# Patient Record
Sex: Female | Born: 1955 | Race: White | Hispanic: No | Marital: Married | Smoking: Former smoker
Health system: Southern US, Community
[De-identification: ages and names within clinical notes are randomized; demographics above are authoritative.]

## PROBLEM LIST (undated history)

## (undated) DIAGNOSIS — M1990 Unspecified osteoarthritis, unspecified site: Secondary | ICD-10-CM

## (undated) DIAGNOSIS — I2510 Atherosclerotic heart disease of native coronary artery without angina pectoris: Secondary | ICD-10-CM

## (undated) DIAGNOSIS — I219 Acute myocardial infarction, unspecified: Secondary | ICD-10-CM

## (undated) DIAGNOSIS — I10 Essential (primary) hypertension: Secondary | ICD-10-CM

## (undated) DIAGNOSIS — E785 Hyperlipidemia, unspecified: Secondary | ICD-10-CM

## (undated) HISTORY — DX: Acute myocardial infarction, unspecified: I21.9

## (undated) HISTORY — DX: Unspecified osteoarthritis, unspecified site: M19.90

## (undated) HISTORY — DX: Essential (primary) hypertension: I10

## (undated) HISTORY — DX: Atherosclerotic heart disease of native coronary artery without angina pectoris: I25.10

## (undated) HISTORY — DX: Hyperlipidemia, unspecified: E78.5

## (undated) HISTORY — PX: NO PAST SURGERIES: SHX2092

---

## 2000-03-29 ENCOUNTER — Encounter (INDEPENDENT_AMBULATORY_CARE_PROVIDER_SITE_OTHER): Payer: Self-pay

## 2000-03-29 ENCOUNTER — Other Ambulatory Visit: Admission: RE | Admit: 2000-03-29 | Discharge: 2000-03-29 | Payer: Self-pay | Admitting: Obstetrics and Gynecology

## 2000-04-11 ENCOUNTER — Ambulatory Visit (HOSPITAL_COMMUNITY): Admission: RE | Admit: 2000-04-11 | Discharge: 2000-04-11 | Payer: Self-pay | Admitting: Obstetrics and Gynecology

## 2000-04-11 ENCOUNTER — Encounter: Payer: Self-pay | Admitting: Obstetrics and Gynecology

## 2003-11-25 ENCOUNTER — Emergency Department (HOSPITAL_COMMUNITY): Admission: EM | Admit: 2003-11-25 | Discharge: 2003-11-25 | Payer: Self-pay

## 2003-11-25 ENCOUNTER — Emergency Department (HOSPITAL_COMMUNITY): Admission: EM | Admit: 2003-11-25 | Discharge: 2003-11-25 | Payer: Self-pay | Admitting: Emergency Medicine

## 2003-11-26 ENCOUNTER — Ambulatory Visit (HOSPITAL_COMMUNITY): Admission: RE | Admit: 2003-11-26 | Discharge: 2003-11-26 | Payer: Self-pay | Admitting: Gastroenterology

## 2003-11-30 ENCOUNTER — Ambulatory Visit (HOSPITAL_COMMUNITY): Admission: RE | Admit: 2003-11-30 | Discharge: 2003-11-30 | Payer: Self-pay | Admitting: Gastroenterology

## 2006-05-22 ENCOUNTER — Encounter: Admission: RE | Admit: 2006-05-22 | Discharge: 2006-05-22 | Payer: Self-pay | Admitting: Family Medicine

## 2006-05-27 ENCOUNTER — Encounter: Admission: RE | Admit: 2006-05-27 | Discharge: 2006-05-27 | Payer: Self-pay | Admitting: Family Medicine

## 2007-03-19 ENCOUNTER — Ambulatory Visit: Payer: Self-pay | Admitting: Cardiology

## 2007-03-26 ENCOUNTER — Ambulatory Visit: Payer: Self-pay

## 2007-03-26 ENCOUNTER — Encounter: Payer: Self-pay | Admitting: Cardiology

## 2007-04-02 ENCOUNTER — Ambulatory Visit: Payer: Self-pay | Admitting: Cardiology

## 2007-04-02 LAB — CONVERTED CEMR LAB
Basophils Absolute: 0.1 10*3/uL (ref 0.0–0.1)
Eosinophils Relative: 3.7 % (ref 0.0–5.0)
GFR calc non Af Amer: 70 mL/min
HCT: 43 % (ref 36.0–46.0)
Hemoglobin: 14.8 g/dL (ref 12.0–15.0)
Lymphocytes Relative: 35.6 % (ref 12.0–46.0)
MCV: 94.5 fL (ref 78.0–100.0)
Monocytes Relative: 6.8 % (ref 3.0–11.0)
Neutro Abs: 6.5 10*3/uL (ref 1.4–7.7)
Potassium: 4.2 meq/L (ref 3.5–5.1)
Prothrombin Time: 11.7 s (ref 10.9–13.3)
RBC: 4.55 M/uL (ref 3.87–5.11)
RDW: 12.3 % (ref 11.5–14.6)
WBC: 12.1 10*3/uL — ABNORMAL HIGH (ref 4.5–10.5)

## 2007-04-04 ENCOUNTER — Ambulatory Visit: Payer: Self-pay | Admitting: Cardiovascular Disease

## 2007-04-04 ENCOUNTER — Inpatient Hospital Stay (HOSPITAL_BASED_OUTPATIENT_CLINIC_OR_DEPARTMENT_OTHER): Admission: RE | Admit: 2007-04-04 | Discharge: 2007-04-04 | Payer: Self-pay | Admitting: Cardiovascular Disease

## 2007-04-08 ENCOUNTER — Ambulatory Visit: Payer: Self-pay

## 2007-04-14 ENCOUNTER — Ambulatory Visit: Payer: Self-pay | Admitting: Cardiology

## 2007-05-12 ENCOUNTER — Ambulatory Visit: Payer: Self-pay | Admitting: Cardiology

## 2007-05-12 LAB — CONVERTED CEMR LAB
ALT: 19 units/L (ref 0–35)
Alkaline Phosphatase: 91 units/L (ref 39–117)
BUN: 23 mg/dL (ref 6–23)
CO2: 27 meq/L (ref 19–32)
Calcium: 9.4 mg/dL (ref 8.4–10.5)
GFR calc Af Amer: 96 mL/min
HDL: 42.6 mg/dL (ref >39.0)
Potassium: 4.7 meq/L (ref 3.5–5.1)
Sodium: 137 meq/L (ref 135–145)
Total Protein: 7.6 g/dL (ref 6.0–8.3)

## 2007-06-19 ENCOUNTER — Ambulatory Visit: Payer: Self-pay | Admitting: Cardiology

## 2007-12-22 ENCOUNTER — Ambulatory Visit: Payer: Self-pay | Admitting: Cardiology

## 2007-12-22 LAB — CONVERTED CEMR LAB
Albumin: 3.8 g/dL (ref 3.5–5.2)
Bilirubin, Direct: 0.1 mg/dL (ref 0.0–0.3)
Cholesterol: 125 mg/dL (ref 0–200)
LDL Cholesterol: 68 mg/dL (ref 0–99)
Total Bilirubin: 0.6 mg/dL (ref 0.3–1.2)
Total CHOL/HDL Ratio: 3.1
VLDL: 16 mg/dL (ref 0–40)

## 2008-03-24 ENCOUNTER — Ambulatory Visit: Payer: Self-pay | Admitting: Cardiology

## 2008-08-11 ENCOUNTER — Encounter (INDEPENDENT_AMBULATORY_CARE_PROVIDER_SITE_OTHER): Payer: Self-pay | Admitting: *Deleted

## 2008-12-07 ENCOUNTER — Telehealth (INDEPENDENT_AMBULATORY_CARE_PROVIDER_SITE_OTHER): Payer: Self-pay | Admitting: *Deleted

## 2009-03-02 ENCOUNTER — Telehealth: Payer: Self-pay | Admitting: Cardiology

## 2009-03-09 DIAGNOSIS — I10 Essential (primary) hypertension: Secondary | ICD-10-CM

## 2009-03-09 DIAGNOSIS — E785 Hyperlipidemia, unspecified: Secondary | ICD-10-CM | POA: Insufficient documentation

## 2009-04-28 ENCOUNTER — Ambulatory Visit: Payer: Self-pay | Admitting: Cardiology

## 2009-04-28 DIAGNOSIS — I2510 Atherosclerotic heart disease of native coronary artery without angina pectoris: Secondary | ICD-10-CM

## 2009-04-28 DIAGNOSIS — I252 Old myocardial infarction: Secondary | ICD-10-CM

## 2009-05-04 ENCOUNTER — Telehealth (INDEPENDENT_AMBULATORY_CARE_PROVIDER_SITE_OTHER): Payer: Self-pay | Admitting: *Deleted

## 2009-05-05 ENCOUNTER — Ambulatory Visit: Payer: Self-pay | Admitting: Cardiology

## 2009-05-05 ENCOUNTER — Encounter (HOSPITAL_COMMUNITY): Admission: RE | Admit: 2009-05-05 | Discharge: 2009-07-19 | Payer: Self-pay | Admitting: Cardiology

## 2009-05-05 ENCOUNTER — Ambulatory Visit: Payer: Self-pay

## 2009-05-05 ENCOUNTER — Ambulatory Visit: Payer: Self-pay | Admitting: Cardiovascular Disease

## 2009-05-09 LAB — CONVERTED CEMR LAB
AST: 21 units/L (ref 0–37)
Cholesterol: 159 mg/dL (ref 0–200)
LDL Cholesterol: 82 mg/dL (ref 0–99)
Potassium: 4.1 meq/L (ref 3.5–5.1)
Sodium: 142 meq/L (ref 135–145)
Total Bilirubin: 0.3 mg/dL (ref 0.3–1.2)
Total Protein: 7.8 g/dL (ref 6.0–8.3)

## 2009-08-11 ENCOUNTER — Telehealth: Payer: Self-pay | Admitting: Cardiology

## 2009-11-02 ENCOUNTER — Telehealth (INDEPENDENT_AMBULATORY_CARE_PROVIDER_SITE_OTHER): Payer: Self-pay | Admitting: *Deleted

## 2009-11-29 ENCOUNTER — Telehealth (INDEPENDENT_AMBULATORY_CARE_PROVIDER_SITE_OTHER): Payer: Self-pay | Admitting: *Deleted

## 2010-04-09 ENCOUNTER — Encounter: Payer: Self-pay | Admitting: Gastroenterology

## 2010-04-18 NOTE — Progress Notes (Signed)
Summary: Nuclear Pre-Procedure  Phone Note Outgoing Call Call back at [facility] Phone [phone]   Call placed by: Stanton Kidney, EMT-P,  May 04, 2009 2:05 PM Call placed to: Patient Action Taken: Phone Call Completed Summary of Call: Reviewed information on Myoview Information Sheet (see scanned document for further details).  Spoke with Patient.    Nuclear Med Background Indications for Stress Test: Evaluation for Ischemia   History: Echo, Heart Catheterization, Myocardial Infarction, Myocardial Perfusion Study  History Comments: 1/09 Echo: EF=40%, wall motion consistant w/ MI 1/09 Heart Cath: EF=45%, 100% LAD w/ coll., RCA=70% 1/09 MPS: EF=40%,  ABN.     Nuclear Pre-Procedure Cardiac Risk Factors: Family History - CAD, History of Smoking, Hypertension, Lipids Height (in): 62

## 2010-04-18 NOTE — Assessment & Plan Note (Signed)
Summary: f88m/jss    Visit Type:  6 mo f/u Primary Provider:  Marny Lowenstein  CC:  pt had hand surgery back in June 2010 on the left hand...no cardiac complaints today.  History of Present Illness: Mrs  [name] comes in today for further evaluation and management of her coronary disease, history of silent myocardial infarction, moderate LV dysfunction, hypertension, mixed hyperlipidemia.  She continues not to smoke. She has lost another 6 or 7 pounds. She feels remarkably well with no symptoms of ischemia or angina. She also denies orthopnea, PND or peripheral edema. She has no significant dyspnea on exertion.  Clinical Reports Reviewed:  Nuclear Study:  04/08/2007:  Excerise capacity: Poor exercise capacity  Blood Pressure response: Hypertensive blood pressure response  Clinical symptoms: There is dyspena  ECG impression: No significant ST segment change suggestive of ischemia  Overall impression: Abnormal stress nulcear study with extensive prior anterior, septal, and apical infarct and minmal peri-infarct ischemia.  Olga Millers, MD   Current Medications (verified): 1)  Carvedilol 6.25 Mg Tabs (Carvedilol) .Marland Kitchen.. 1 Tab Two Times A Day 2)  Lisinopril 5 Mg Tabs (Lisinopril) .Marland Kitchen.. 1 Tab Once Daily 3)  Simvastatin 40 Mg Tabs (Simvastatin) .Marland Kitchen.. 1 Tab At Bedtime 4)  Aspirin 81 Mg Tbec (Aspirin) .... Take One Tablet By Mouth Daily  Allergies: 1)  ! Sulfa 2)  ! Pcn 3)  ! * Ivp Dye  Past History:  Past Medical History: Last updated: 04-07-2009 HYPERTENSION (ICD-401.9) HYPERLIPIDEMIA (ICD-272.4) TOBACCO USE, QUIT (ICD-V15.82)    Past Surgical History: Last updated: Apr 07, 2009 No surgery to report  Family History: Last updated: 04-07-2009 Father: deceased  Social History: Last updated: 04-07-2009 Full Time Married  Tobacco Use - Former. ..quit 2008 Alcohol Use - yes Drug Use - no  Risk Factors: Smoking Status: quit (2009-04-07)  Review of Systems   negative other than history of present illness  Vital Signs:  Patient profile:   55 year old female Height:      62 inches Weight:      151 pounds BMI:     27.72 Pulse rate:   73 / minute Pulse rhythm:   irregular BP sitting:   134 / 90  (left arm) Cuff size:   large  Vitals Entered By: Danielle Rankin, CMA (April 28, 2009 11:12 AM)  Physical Exam  General:  Well developed, well nourished, in no acute distress. Head:  normocephalic and atraumatic Eyes:  PERRLA/EOM intact; conjunctiva and lids normal. Mouth:  Teeth, gums and palate normal. Oral mucosa normal. Neck:  Neck supple, no JVD. No masses, thyromegaly or abnormal cervical nodes. Chest Shaquavia Whisonant:  no deformities or breast masses noted Lungs:  Clear bilaterally to auscultation and percussion. Heart:  Non-displaced PMI, chest non-tender; regular rate and rhythm, S1, S2 without murmurs, rubs or gallops. Carotid upstroke normal, no bruit. Normal abdominal aortic size, no bruits. Femorals normal pulses, no bruits. Pedals normal pulses. No edema, no varicosities. Abdomen:  Bowel sounds positive; abdomen soft and non-tender without masses, organomegaly, or hernias noted. No hepatosplenomegaly. Msk:  Back normal, normal gait. Muscle strength and tone normal. Pulses:  pulses normal in all 4 extremities Extremities:  No clubbing or cyanosis. Neurologic:  Alert and oriented x 3.   Problems:  Medical Problems Added: 1)  Dx of Encounter For Long-term Use of Other Medications  (ICD-V58.69) 2)  Dx of Old Myocardial Infarction  (ICD-412) 3)  Dx of Cad, Native Vessel  (ICD-414.01)  EKG  Procedure date:  04/28/2009  Findings:  normal sinus rhythm, low voltage QRS, poor R. progression across the precordium consistent with a previous anterior [place] Valliant infarct. Nonspecific ST segment changes. EKG is stable.  Impression & Recommendations:  Problem # 1:  CAD, NATIVE VESSEL (ICD-414.01) Assessment Unchanged  Her updated medication list for  this problem includes:    Carvedilol 6.25 Mg Tabs (Carvedilol) .Marland Kitchen... 1 tab two times a day    Lisinopril 5 Mg Tabs (Lisinopril) .Marland Kitchen... 1 tab once daily    Aspirin 81 Mg Tbec (Aspirin) .Marland Kitchen... Take one tablet by mouth daily  Orders: EKG w/ Interpretation (93000) Nuclear Stress Test (Nuc Stress Test)  Problem # 2:  OLD MYOCARDIAL INFARCTION (ICD-412) Assessment: Unchanged She had a silent MI in the past. It is clinically indicated to perform a stress test with objective assessment of her coronary disease each year. We will arrange for this as an exercise rest rest Myoview off the beta blocker. Her updated medication list for this problem includes:    Carvedilol 6.25 Mg Tabs (Carvedilol) .Marland Kitchen... 1 tab two times a day    Lisinopril 5 Mg Tabs (Lisinopril) .Marland Kitchen... 1 tab once daily    Aspirin 81 Mg Tbec (Aspirin) .Marland Kitchen... Take one tablet by mouth daily  Problem # 3:  HYPERTENSION (ICD-401.9) Assessment: Improved  Her updated medication list for this problem includes:    Carvedilol 6.25 Mg Tabs (Carvedilol) .Marland Kitchen... 1 tab two times a day    Lisinopril 5 Mg Tabs (Lisinopril) .Marland Kitchen... 1 tab once daily    Aspirin 81 Mg Tbec (Aspirin) .Marland Kitchen... Take one tablet by mouth daily  Problem # 4:  HYPERLIPIDEMIA (ICD-272.4) Assessment: Unchanged  she needs fasting lipids and a conference metabolic panel. Her updated medication list for this problem includes:    Simvastatin 40 Mg Tabs (Simvastatin) .Marland Kitchen... 1 tab at bedtime  Patient Instructions: 1)  Your physician recommends that you schedule a follow-up appointment in: 12 MONTHS WITH DR Lorelei Heikkila 2)  Your physician recommends that you return for lab work in:DAY OF STRESS TEST BMET LIPID LIVER 3)  Your physician recommends that you continue on your current medications as directed. Please refer to the Current Medication list given to you today. 4)  Your physician has requested that you have an exercise stress myoview.  For further information please visit [email or web address]   Please follow instruction sheet, as given.

## 2010-04-18 NOTE — Progress Notes (Signed)
  Request recieved from [facility] sent to [facility]  November 02, 2009 9:33 AM

## 2010-04-18 NOTE — Assessment & Plan Note (Signed)
Summary: Cardiology Nuclear Study  Nuclear Med Background Indications for Stress Test: Evaluation for Ischemia   History: Echo, Heart Catheterization, Myocardial Infarction, Myocardial Perfusion Study  History Comments: 1/09 Echo: EF=40%, wall motion consistant w/ MI 1/09 Heart Cath: EF=45%, 100% LAD w/ coll., RCA=70% 1/09 MPS: EF=40%,  ABN.  Symptoms: Palpitations    Nuclear Pre-Procedure Cardiac Risk Factors: Family History - CAD, History of Smoking, Hypertension, Lipids Caffeine/Decaff Intake: None NPO After: 12:00 AM Lungs: clear IV 0.9% NS with Angio Cath: 22g     IV Site: (R) AC IV Started by: Irean Hong RN Chest Size (in) 36     Cup Size B     Height (in): 63 Weight (lb): 149 BMI: 26.49  Nuclear Med Study 1 or 2 day study:  1 day     Stress Test Type:  Stress Reading MD:  Charlton Haws, MD     Referring MD:  T.Wall Resting Radionuclide:  Technetium 75m Tetrofosmin     Resting Radionuclide Dose:  10.0 mCi  Stress Radionuclide:  Technetium 3m Tetrofosmin     Stress Radionuclide Dose:  33.0 mCi   Stress Protocol Exercise Time (min):  5:00 min     Max HR:  166 bpm     Predicted Max HR:  165 bpm  Max Systolic BP: 166 mm Hg     Percent Max HR:  100.61 %     METS: 7.0 Rate Pressure Product:  04540    Stress Test Technologist:  Milana Na EMT-P     Nuclear Technologist:  Burna Mortimer Deal RT-N  Rest Procedure  Myocardial perfusion imaging was performed at rest 45 minutes following the intravenous administration of Myoview Technetium 82m Tetrofosmin.  Stress Procedure  The patient exercised for 5:00. The patient stopped due to fatigue and denied any chest pain.  There were no significant ST-T wave changes and occ pvcs.  Myoview was injected at peak exercise and myocardial perfusion imaging was performed after a brief delay.  QPS Raw Data Images:  Normal; no motion artifact; normal heart/lung ratio. Stress Images:  Antapical infarct Rest Images:  Antapical  infarct Subtraction (SDS):  SDS 4 Transient Ischemic Dilatation:  1.17  (Normal <1.22)  Lung/Heart Ratio:  .32  (Normal <0.45)  Quantitative Gated Spect Images QGS EDV:  96 ml QGS ESV:  53 ml QGS EF:  45 % QGS cine images:  Anteroapical hypokinesis  Findings Low risk nuclear study  Evidence for anterior (septal apical) infarct  Evidence for LV Dysfunction LV Dysfunction    Overall Impression  Exercise Capacity: Poor exercise capacity. BP Response: Normal blood pressure response. Clinical Symptoms: No chest pain ECG Impression: No significant ST segment change suggestive of ischemia. Overall Impression: Moderate anteroapical infarct with no ischemia  Appended Document: Cardiology Nuclear Study stable. No change in treatment.  Appended Document: Cardiology Nuclear Study PT AWARE./CY

## 2010-04-18 NOTE — Progress Notes (Signed)
  EMSi request recieved sent to [facility]. Cal[facility]  November 29, 2009 12:59 PM     Appended Document:  EMSI request recieved sent to [facility]   Appended Document:  2nd request recieved from [facility] sent to [facility]   Appended Document:  Correction...this is the 3rd request

## 2010-04-18 NOTE — Progress Notes (Signed)
Summary:  upset why she was not told she had coronary heart disease  Phone Note Call from Patient Call back at Home Phone [phone]   Caller: Patient Reason for Call: Talk to Nurse Summary of Call: Per pt called, pt is pretty past upset that she was not disclosed of a conditon coronary heart disease. was told by susan in our office that the visit was not preventive care. now pt have $5,000  bill. pt express that the lady was very rude to her on the phone. office visit in 2/11. the test result  was given that her heart was stronger than ever. and the stress test normal. pt would like a call back today .  Initial call taken by: Lorne Skeens,  Aug 11, 2009 3:27 PM  Follow-up for Phone Call        SPOKE WITH PT IS UPSET BECAUSE WAS TOLD HAD CAD NOT AWARE IS ALSO UPSET BECAUSE STRESS WAS CODED AS IF HAD SYMPTOMS NO S/S PER PT IF CODED AS PREVENTIVE INSURANCE WOULD COVER ALL BUT CO PAY. Follow-up by: Scherrie Bateman, LPN,  Aug 11, 2009 3:57 PM  Additional Follow-up for Phone Call Additional follow up Details #1::        Spoke with pt at lenght. Victorino Dike will speak with Cydney Ok about coding and bill. Unfortunately we may not be able to do anything about [employer]. Additional Follow-up by: Gaylord Shih, MD, [facility],  Aug 12, 2009 10:00 AM     Appended Document:  upset why she was not told she had coronary heart disease The professional fee charges for interpretation appear to have been paid with less than $300 potentially charged to the patient.  An e-mail has been sent to [facility] for details of the charges from [facility] for that DOS.  More information to follow.  Appended Document:  upset why she was not told she had coronary heart disease OK. I suspect hospital charges may be revealing.  Appended Document:  upset why she was not told she had coronary heart disease The total charge for a nuclear study, billed the way that it is being billed, now, is close to $5000.  I cannot determine if the  change in the amount she had to pay was a result in the billing change or if it would have happened, regardless, given the way her particular policy is constructed.  No errors were made in coding or billing.  Efforts are being in our department to informa patients of the need for patients to inquire with their insurance company about coverage for imaging, in advance.  Appended Document:  upset why she was not told she had coronary heart disease ok...Marland KitchenMarland KitchenMarland Kitchenhopefully she didnt have to pay 5K. We need to discuss in next Section meeting. Please put on agenda and how much she had to pay.

## 2010-08-01 NOTE — Assessment & Plan Note (Signed)
[facility]                            CARDIOLOGY OFFICE NOTE   NAME:[name], Hailey CLEMENTS                      MRN:          [id number]  DATE:05/12/2007                            DOB:          July 16, 1955    [name] comes in today for further followup  of the following  issues.  1. Coronary artery disease with out of hospital infarct of the      anterior septal wall.  She has EF of 40%.  2. Chronic systolic heart failure  3. Probable hyperlipidemia.  4. Hypertension.  5. History of tobacco use.  She has not smoked in 10 months!   She would like to start walking on a treadmill.  I have encouraged to do  so.   She smells of tobacco but she says her husband smokes.   She has had a stomach ache, like eating a green apple.  She said this  has started since she started Carvedilol.  She is aware and I also  reinforce that this is very unlikely.  She denies any constipation.  She  has had no melena or hematochezia.  She denies any orthopnea, PND,  peripheral edema.  She does have some mild dyspnea on exertion.  She has  had no ischemic symptoms.   MEDICATIONS:  Lisinopril 5 mg a day, aspirin 81 mg a day,  Carvedilol  3.125 mg p.o. b.i.d..   She is for fasting lipids, comprehensive metabolic panel today.   Blood pressure 133/86, her pulse 77 and regular.  Her weight is 166 down  a pound.  HEENT:  Normocephalic, atraumatic.  PERRL.  Extraocular  movements is intact.  Sclera clear.  Facial symmetry normal.  NECK:  Carotids upstrokes are equal bilaterally without bruits, no JVD.  Thyroid is not enlarged.  Trachea is midline.  LUNGS were clear.  HEART:  Reveals a nondisplaced PMI.  She has normal S1-S2 without  gallop.  ABDOMEN:  Soft, good bowel sounds.  No organomegaly.  No lymphadenopathy  .  EXTREMITIES:  There is no sinus clubbing or edema.  Pulses are intact.  NEURO:  Exam is intact.  SKIN is unremarkable.   I have had a nice chat with [name] today.  I have encouraged her to  do the following:  1. Begin walking on her treadmill up to 3 hours per week.  2. Increase Carvedilol to 6.25 b.i.d..  If she has more problems or      worsening problems with her stomachache, she will call us before      she stops it.  3. Fasting lipids and comprehensive metabolic panel today.  She will      most likely need a statin as outlined in the aggressive goals for      her.  4. Continue aspirin.  5. Continue low-dose lisinopril which may have to be increased in the      future.   I will see her back in about 4 weeks.     Thomas C. Daleen Squibb, MD, [facility]  Electronically Signed    TCW/MedQ  DD: 05/12/2007  DT: 05/12/2007  Job #: [id number]

## 2010-08-01 NOTE — Assessment & Plan Note (Signed)
[facility]                            CARDIOLOGY OFFICE NOTE   NAME:[name], [name]                      MRN:          [id number]  DATE:04/02/2007                            DOB:          [birth year]    CHIEF COMPLAINT:  Fatigue and low energy level.   HISTORY OF PRESENT ILLNESS:  [name] is a 55 year old married  white female whom I saw in the office on March 19, 2007, with the  above complaint.  She was self-referred by one of my patients, her  father, Mr. Jenne Pane.   She has not been to a doctor on a regular basis.  She was told she had  hypertension several years ago, and took a low-dose of lisinopril 2.5 mg  a day.  She then lost 55 pounds with a drop in her blood pressure, at  which time the lisinopril was discontinued.   Her EKG in the office showed poor R-wave progression across the anterior  precordium.  Because of her hypertension and these findings, we obtained  a 2-D echocardiogram.  This showed septal and apical akinesia and  anterior hypokinesia.  Left ventricle was moderately dilated.  Her EF  was around 40%.  There was no significant LVH.  Left atrium was mildly  dilated.  The aortic valve was mildly calcific calcified.  She had  trivial aortic insufficiency.  She has had some dyspnea on exertion, but  no angina.   PAST MEDICAL HISTORY:  She reports having a red fascial reaction to CT  scan with contrast.   ALLERGIES:  She is allergic to SULFA and PENICILLIN.   CURRENT MEDS:  Lisinopril 5 mg a day.  I started this is in the office  last visit.   She does she does drink alcohol.  She does drink caffeine.  She quit  smoking 6 months ago.   SURGERY:  None.   FAMILY HISTORY:  Father has a history of diastolic dysfunction,  hypertension.  Her brother has also had some problems with this.   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  She is married and has no  children.  Her husband, Alinda Money, is with her today.   REVIEW OF SYSTEMS:  She has a history of a hiatal hernia and some bad  reflux in the past.  She is currently stable.  The rest of her review of  systems is negative.  She specifically denies any orthopnea, PND, or  peripheral edema.   PHYSICAL EXAMINATION:  A very pleasant lady in no acute distress.  Her blood pressure is 123/90; pulse is 81 and regular.  Weight is 163.  She is 5 feet 3 inches.  HEENT:  Normocephalic, atraumatic.  PERLA.  Extraocular movements  intact.  Sclerae are clear.  Face symmetry is normal.  Carotid upstrokes were equal bilaterally without bruits.  There is no  JVD.  Thyroid is not enlarged.  Trachea is midline.  LUNGS:  Clear.  HEART:  Reveals a nondisplaced PMI.  Normal S1-S2.  No gallop.  ABDOMINAL EXAM:  Soft, good bowel sounds.  EXTREMITIES:  No sinus clubbing or edema.  Pulses are intact.  MUSCULOSKELETAL:  Negative.  SKIN:  Unremarkable.   ASSESSMENT/PROBLEM LIST:  1. Fatigue, dyspnea on exertion, and a 2-D echocardiogram that says      several segmental wall motion abnormalities.  She may very well      have obstructive coronary disease.  She also had a viral illness      over the holidays, but this is not a global effect on her 2-D.  2. History of tobacco use which she quit 6 months ago.  3. Family history of heart disease.  4. Hypertension.  5. Possible contrast reaction in the past.   RECOMMENDATIONS:  I have recommended a heart catheterization as an  outpatient.  Indications, risks, and potential benefits have been  discussed.  We will prep her for a potential dye reaction.  She agrees  to proceed.     Thomas C. Daleen Squibb, MD, [facility]  Electronically Signed    TCW/MedQ  DD: 04/02/2007  DT: 04/02/2007  Job #: [id number]   cc:   Jethro Bastos, M.D.

## 2010-08-01 NOTE — Assessment & Plan Note (Signed)
[facility]                            CARDIOLOGY OFFICE NOTE   [name], [name]                      MRN:          [id number]  DATE:04/14/2007                            DOB:          12-14-1955    [name] comes in today for further management of her coronary  disease.   She initially presented to me with fatigue and shortness of breath.  Because of a viral illness and history of hypertension, we ordered a 2-D  echocardiogram to rule out a cardiomyopathy.  This surprisingly showed a  moderately dilated, left ventricle, septal and apical akinesia and  anterior hypokinesia with an EF of 40%.  There was mild, left atrial  dilatation.  The aortic valve was mildly calcified.   I discussed the findings with Dr. Eden Emms who read the echocardiogram.  We felt this was most likely consistent with coronary disease.   She relates a history of becoming extremely angry with her brother a  year ago at [facility].  She had severe chest pressure and shortness  of breath that only lasted about 5 minutes.  She thinks this is when she  may have had the heart attack.   We then performed a heart catheterization which showed a totally  occluded LAD that looked chronic.  There was moderate left to left  collaterals a very good right to left collaterals.  The circumflex had a  20% discrete disease proximally.  There was a 50-60% ostial lesion in  the first obtuse marginal.  The second obtuse marginal was large and  free of disease.  The right coronary was dominant.  There was a 70%  tubular lesion proximally.  Her EF was 45% with a fairly hyperdynamic  basal and mid function, but showed apical dyskinesia.  There was no  gradient across the aortic valve and no mitral regurgitation.   We reviewed the films with Dr. Juanda Chance and Dr. Excell Seltzer.  It was felt that  there would be no significant advantage and it may be actually be ridden  with complications trying to open  her totally occluded LAD.  We  performed a stress Myoview to see if there was any significant ischemia.   She only exercised for 4 minutes.  Her heart rate went to 163 which is  98% of predicted maximum heart rate.  Level achieved was 5.8.  She  became fatigue, short of breath, but did not have any chest pain.  Her  EF was 40% with akinesia of the distal anterior wall and apex.  There  was evidence of left ventricular enlargement.  She had minimal peri-  infarct ischemia with anterior septal and apical infarction.   I have reviewed all these findings with her today.  Her risk factors in  the past have been smoking, which she has discontinued and I have  reinforced, hypertension and family history.  I do not know her lipid  status.   We started her on low-dose lisinopril 5 mg a day for her hypertension  prior to all these studies.  Her blood pressure has been better.  She  denies any dizziness.  Her shortness of breath has slowly improved.  She  is anxious about treadmill.   PHYSICAL EXAMINATION:  VITAL SIGNS:  Her blood pressure today is 114/78,  her pulse 88 and regular.  Weight is 167 up 4 pounds.  The rest of her  exam is unchanged.   ASSESSMENT/PLAN:  I have had a long talk with [name] today.  I have  recommended the following:  1. Obtain a treadmill and walk at least 3 hours per week.  She will      note a gradual improvement in her functional capacity.  2. No smoking which she has resolved not to do.  3. Fasting lipids and LFTs.  She will definitely need a statin.  We      just need to determine which one.  4. Continue lisinopril 5 mg a day.  5. Continue aspirin 81-325 mg a day.  6. Begin Carvedilol 3.125 b.i.d.  Orthostatic precautions were given.      We will see her back in a couple weeks and titrate up to a resting      heart rate of 60.  Her blood pressure is already under good      control.     Thomas C. Daleen Squibb, MD, [facility]  Electronically Signed    TCW/MedQ   DD: 04/14/2007  DT: 04/15/2007  Job #: [id number]

## 2010-08-01 NOTE — Assessment & Plan Note (Signed)
[facility]                            CARDIOLOGY OFFICE NOTE   NAME:[name], [name]                      MRN:          [id number]  DATE:06/19/2007                            DOB:          1955/11/22    [name] comes in today for followup.   PROBLEM LIST:  1. Coronary artery disease with out-of-hospital infarct of the      anteroseptal wall.  She has an ejection fraction of 40%.  She is      asymptomatic.  Her stress Myoview, April 08, 2007, showed poor      exercise tolerance; however, she has had no significant ischemia.      She had an extensive anteroseptal and apical scar.  2. Chronic systolic heart failure, currently class II at the most.  3. Hyperlipidemia.  4. Hypertension.  5. History tobacco use, which she has discontinued permanently.   She is not doing any walking.   MEDICATIONS:  1. Simvastatin 40 mg day.  2. Carvedilol 6.25 b.i.d.  3. Lisinopril 5 mg a day.  4. Enteric-coated aspirin 81 mg a day.   She will be due lipids and LFTs in about 2 weeks; we will arrange for  these.  She knows that simvastatin is not going to get her to goal;  however, it is generic and it is not expensive.   PHYSICAL EXAMINATION:  Her blood pressure is 130/85.  Her pulse is 67  and regular.  Weight is 167.  HEENT:  Unchanged.  Carotids are full.  LUNGS:  Clear.  HEART:  Reveals regular rate and rhythm.  EXTREMITIES:  No edema.  Pulses are present.   ASSESSMENT AND PLAN:  [name] is doing well.  I have encouraged her  to start walking 3 hours a week.  We will check lipids and LFTs in 2  weeks.  I will see her back again in 6 months.     Thomas C. Daleen Squibb, MD, [facility]  Electronically Signed    TCW/MedQ  DD: 06/19/2007  DT: 06/19/2007  Job #: [id number]

## 2010-08-01 NOTE — Assessment & Plan Note (Signed)
[facility]                            CARDIOLOGY OFFICE NOTE   NAME:[name], [name]                      MRN:          [id number]  DATE:03/24/2008                            DOB:          Jul 18, 1955    Ms. [name] comes in today for followup.   She is doing remarkably well since she feels the best she has ever felt.  She has lost a total of 22 pounds and looks remarkably good.  Her weight  is down to 153.  She is very compliant with her medications and she had  a brave response to simvastatin 40 dropping her total cholesterol from  255 to 125, her LDL from 203 to 68, HDL stayed about 40.5, and  triglycerides are normal.  LFTs are normal.   She has had no angina.  She denies any orthopnea, PND, or peripheral  edema.   Her blood pressure has been good.   MEDICATIONS:  1. Lisinopril 5 mg a day.  2. Enteric-coated aspirin 81 mg a day.  3. Carvedilol 6.25 b.i.d.  4. Simvastatin 40 mg a day.   PHYSICAL EXAMINATION:  VITAL SIGNS:  Her blood pressure today is 135/70  in the left arm, pulse 65 and regular.  HEENT:  Normal.  Carotid upstrokes are equal bilateral without bruits.  No JVD.  Thyroid is not enlarged.  Trachea is midline.  LUNGS:  Clear to auscultation and percussion.  HEART:  Nondisplaced PMI.  No gallop.  No murmur.  ABDOMEN:  Soft, good bowel sounds.  No midline bruit.  No hepatomegaly.  EXTREMITIES:  There is no cyanosis, clubbing, or edema.  Pulses are  intact.  NEUROLOGIC:  Intact.   Her EKG shows normal sinus rhythm, poor progression across the  precordium consistent with previous anteroseptal wall infarct.   ASSESSMENT AND PLAN:  [name] is doing remarkably well.  Much of this is  due to her good therapy lifestyle changes and choices.  She is planning  on getting her weight down to about 140-145.  We will see her back again  in 6 months.     Thomas C. Daleen Squibb, MD, [facility]  Electronically Signed    TCW/MedQ  DD: 03/24/2008  DT:  03/25/2008  Job #: [id number]

## 2010-08-01 NOTE — Assessment & Plan Note (Signed)
[facility]                            CARDIOLOGY OFFICE NOTE   NAME:[name], [name]                      MRN:          [id number]  DATE:03/19/2007                            DOB:          May 23, 1955    CHIEF COMPLAINT:  Low energy level.   HISTORY OF PRESENT ILLNESS:  Mrs. [name] is a 55 year old married  white female who comes in today with the above complaint.  She is the  daughter of one of my patients, Mr. Jenne Pane.   She has had a viral upper respiratory infection which has been  dramatized by paroxysms of sudden coughing.  She now has what sounds  like positional vertigo.  This is associated with nausea.   She has also noticed some shortness of breath and seems to be more short  of breath which she lies down.   She has a history of hypertension and actually took a low dose of  lisinopril 2.5 mg a day for a number of years.  She then loss 55 pounds,  with a drop her blood pressure, which at the time she stopped the  lisinopril.   PAST MEDICAL HISTORY:  She is allergic to SULFA and PENICILLIN.  She has  had no dye reactions.   She quit smoking 6 months ago.   She does drink alcohol.  She drinks caffeine daily.   SURGERY:  None.   CURRENT MEDICATIONS:  None.   FAMILY HISTORY:  Her father has a significant problem with diastolic  dysfunction, some related to hypertension.  She says that her brother  also has this problem.   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  He is married and has no  children.   REVIEW OF SYSTEMS:  She has a history of a hiatal hernia and had some  really bad reflux in the past.  This is currently not a problem.  The  rest of her review of systems is negative.   PHYSICAL EXAMINATION:  VITAL SIGNS:  Her blood pressure was initially  146/96.  We rechecked it, and it was 135/94.  Her pulse is 80.  She is 5  feet 3 inches and weighs 161 our scales.  HEENT:  Normocephalic, atraumatic.  PERRL.   Extraocular movements  intact.  Sclerae are clear.  There is no nystagmus.  Facial symmetry is  normal.  No evidence of facial droop.  Dentition is satisfactory.  NECK:  Supple.  Carotid upstrokes are equal bilaterally, without bruits.  No JVD.  Thyroid is not enlarged.  Trachea is midline.  LUNGS;  Clear.  There were no rales, no rhonchi.  HEART:  Her PMI is nondisplaced.  She has normal S1, S2 without gallop.  ABDOMEN:  Soft.  Good bowel sounds.  No midline bruit.  EXTREMITIES:  Revealed no edema.  Pulses are intact.  NEUROLOGIC:  Intact.  MUSCULOSKELETAL:  Negative.  SKIN:  Unremarkable.   Electrocardiogram is essentially normal.  There is poor  R-wave  progression in V1-V2, but I think this is probably within normal limits.   ASSESSMENT:  1. Benign positional  vertigo, following an upper respiratory      infection.  2. Question of orthopnea and dyspnea on exertion.  3. Hypertension.   RECOMMENDATIONS:  1. Meclizine 25 mg p.o. b.i.d. p.r.n..  I told her this vertigo would      probably resolve slowly over the next the weeks.  2. A 2D echocardiogram to assess degree of LV hypertrophy and LV      function.  3. Begin lisinopril 5 mg per day.  She took 2.5 in the past.  She may      need to have this does increased, as we have informed her today.     Thomas C. Daleen Squibb, MD, [facility]  Electronically Signed    TCW/MedQ  DD: 03/19/2007  DT: 03/19/2007  Job #: [id number]
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
## 2010-08-01 NOTE — Cardiovascular Report (Signed)
[name], [name]               ACCOUNT NO.:  [id number]   MEDICAL RECORD NO.:  [id number]          PATIENT TYPE:  OIB   LOCATION:  NA                           FACILITY:  [facility]   PHYSICIAN:  Peter C. Eden Emms, MD, FACCDATE OF BIRTH:  [birth year]   DATE OF PROCEDURE:  DATE OF DISCHARGE:                            CARDIAC CATHETERIZATION   CORONARY ARTERIOGRAPHY:  A 50-year patient with abnormal echo suggesting  septal and apical hypokinesis.   Cine catheterization done with 4-French catheters from right femoral  artery.   Left main coronary was normal.   The left anterior descending artery was 100% occluded proximally.  There  was a short segment of nonvisualization.  There was moderate left-to-  left collaterals and very good right-to-left collaterals.  The  circumflex coronary artery had 20% discrete disease proximally.  The  first obtuse marginal branch was medium size with a 50-60% ostial  lesion, second obtuse marginal branch was large and free of disease.  There was a small AV groove branch.   The right coronary artery was dominant.  There was a 60% tubular lesion  proximally.  It involved the takeoff of the sinoatrial and atrial  branches, mid to distal RCA were normal.  There were highly developed  collaterals right-to-left.   RIGHT VENTRICULOGRAPHY:  Right ventriculography showed apical  dyskinesis.  EF was still 45% with fairly hyperdynamic basal and mid  function.  There was no gradient across the aortic valve and no MR.  LV  pressure was 130/90, aortic pressure is 130/74.   The patient apparently has a dye allergy with primarily involves  urticaria and redness in the face.  She tolerated the procedure well.   There is no evidence of dye reaction.  She was premedicated.   IMPRESSION:  I have reviewed the films with Dr. Excell Seltzer and Juanda Chance.  They  do not feel that the RCA is flow limiting.  They also feel that the  ostial and proximal LAD are too diseased to  consider PCI of the total  occlusion.  There is concern on my part about how to follow the patient  since the LAD was occluded without symptoms and a perfusion study would  only likely be abnormal in the LAD distribution.  After much discussion  with Dr. Daleen Squibb as well we decided to have the patient come back for a  stress myovue.  If she has evidence of marked ischemia in the  LAD territory or LV cavity dilatation consideration of CABG may be in  order.  If she does well on a stress test without much scintigraphic  evidence of ischemia an initial trial of medical therapy would be in  order.   Again, the patient is not having chest pain and this would be somewhat  more of a difficult decision.      Noralyn Pick. Eden Emms, MD, [facility]  Electronically Signed     PCN/MEDQ  D:  04/04/2007  T:  04/04/2007  Job:  [id number]   cc:   Thomas C. Wall, MD, [facility]

## 2010-08-04 NOTE — Op Note (Signed)
[name], [name]                         ACCOUNT NO.:  [id number]   MEDICAL RECORD NO.:  [id number]                   PATIENT TYPE:  AMB   LOCATION:  ENDO                                 FACILITY[facility]  [facility]   PHYSICIAN:  James L. Malon Kindle., M.D.          DATE OF BIRTH:  [birth year]   DATE OF PROCEDURE:  11/26/2003  DATE OF DISCHARGE:                                 OPERATIVE REPORT   PROCEDURE:  Esophagogastroduodenoscopy.   MEDICATIONS:  Cetacaine spray, fentanyl 50 mcg, Versed 4 mg IV.   INDICATIONS:  Patient has had vague chest pain and upper abdominal pain.  Has been so far undiagnosed.  She has had several visits to the ER and has  been seen by the primary care doctor with chest x-ray, labs, EKGs.  Everyone  has felt that she has had a musculoskeletal costochondritis.  The pain seems  to radiate down into her epigastrium and seems to get worse with swallowing.  She has had at least three ER visits for this over the past two weeks and  has failed to completely resolve with all types of pain medicines, including  narcotics and anti-inflammatory drugs.  A recent trip to the ER last night  revealed completely normal liver functions, amylase, lipase, CBC, and a  gallbladder ultrasound.  Urinalysis was normal as well.  An upper endoscopy  was done as part of her workup to continue to try to diagnose this worsening  cause of her pain.   DESCRIPTION OF PROCEDURE:  The procedure had been explained to the patient  and consent obtained.  In the left lateral decubitus position, the scope was  inserted with agglutination of gas in the esophagus.  The stomach was  entered, __________ passed.  The duodenum, including the bulb and second  portion, was seen well and was unremarkable.  The scope was withdrawn back  into the stomach.  The pyloric channel, antrum, and body were normal.  The  fundus and cardia were seen well on the retroflexed view and were normal.  The GE junction was widely  patent.  There was a small hiatal hernia.  The  distal and proximal esophagus were endoscopically normal.  The scope was  withdrawn.  The patient tolerated the procedure well.   ASSESSMENT:  Probable esophageal reflux, suggested by endoscopy.  I would  doubt this is the source of her pain.  530.81.   PLAN:  Will increase her Aciphex to b.i.d.  Will give her reflux  instructions sheet and see back in the office in three weeks.  Will obtain a  CT scan of her abdomen and chest with oral and IV contrast.                                               Fayrene Fearing  Vernetta Honey., M.D.    Waldron Session  D:  11/26/2003  T:  11/27/2003  Job:  [id number]   cc:   Jethro Bastos, M.D.  [address]  [place]  [place] [address]  Fax: [phone]

## 2011-02-13 ENCOUNTER — Other Ambulatory Visit: Payer: Self-pay | Admitting: Cardiology

## 2011-02-19 ENCOUNTER — Encounter: Payer: Self-pay | Admitting: *Deleted

## 2011-02-20 ENCOUNTER — Encounter: Payer: Self-pay | Admitting: Cardiology

## 2011-02-20 ENCOUNTER — Ambulatory Visit (INDEPENDENT_AMBULATORY_CARE_PROVIDER_SITE_OTHER): Payer: BC Managed Care – PPO | Admitting: Cardiology

## 2011-02-20 VITALS — BP 142/90 | HR 73 | Ht 65.0 in | Wt 145.0 lb

## 2011-02-20 DIAGNOSIS — E785 Hyperlipidemia, unspecified: Secondary | ICD-10-CM

## 2011-02-20 DIAGNOSIS — I2510 Atherosclerotic heart disease of native coronary artery without angina pectoris: Secondary | ICD-10-CM

## 2011-02-20 DIAGNOSIS — I252 Old myocardial infarction: Secondary | ICD-10-CM

## 2011-02-20 DIAGNOSIS — I10 Essential (primary) hypertension: Secondary | ICD-10-CM

## 2011-02-20 MED ORDER — NITROGLYCERIN 0.4 MG SL SUBL: 0.4000 mg | SUBLINGUAL_TABLET | SUBLINGUAL | Status: DC | PRN

## 2011-02-20 NOTE — Progress Notes (Signed)
HPI [name] comes in today for evaluation and management of her coronary disease. We have not seen her in quite some time. She has not seen her primary care physician close to 5 years.  She's taking great care herself. He is not smoking, has kept her weight down, exercises, and is  limited fast foods from her diet. She is compliant with her medications.  She has not had any angina or chest pain. She has a history of reflux from a hiatal hernia that has responded to nitroglycerin in the past. She had a out of hospital MI  When we first met.  She is overdue for lipids and LFT checks.  Past Medical History  Diagnosis Date  . Hyperlipidemia   . Hypertension     Current Outpatient Prescriptions  Medication Sig Dispense Refill  . aspirin 81 MG tablet Take 81 mg by mouth daily.        . carvedilol (COREG) 6.25 MG tablet TAKE 1 TABLET TWICE A DAY  60 tablet  3  . lisinopril (PRINIVIL,ZESTRIL) 5 MG tablet TAKE 1 TABLET BY MOUTH EVERY DAY  30 tablet  3  . simvastatin (ZOCOR) 40 MG tablet Take 40 mg by mouth at bedtime.          Allergies  Allergen Reactions  . Penicillins   . Sulfonamide Derivatives     No family history on file.  History   Social History  . Marital Status: Married    Spouse Name: N/A    Number of Children: N/A  . Years of Education: N/A   Occupational History  .      full time   Social History Main Topics  . Smoking status: Former Smoker    Types: Cigarettes  . Smokeless tobacco: Never Used   Comment: quit 2008  . Alcohol Use: Yes     socially  . Drug Use: No  . Sexually Active:    Other Topics Concern  . Not on file   Social History Narrative  . No narrative on file    ROS ALL NEGATIVE EXCEPT THOSE NOTED IN HPI  PE  General Appearance: well developed, well nourished in no acute distress HEENT: symmetrical face, PERRLA, good dentition  Neck: no JVD, thyromegaly, or adenopathy, trachea midline Chest: symmetric without deformity Cardiac:  PMI non-displaced, RRR, normal S1, S2, no gallop or murmur Lung: clear to ausculation and percussion Vascular: all pulses full without bruits  Abdominal: nondistended, nontender, good bowel sounds, no HSM, no bruits Extremities: no cyanosis, clubbing or edema, no sign of DVT, no varicosities  Skin: normal color, no rashes Neuro: alert and oriented x 3, non-focal Pysch: normal affect  EKG Normal sinus rhythm, low voltage, poor R-wave progression in nature precordium with a history of anterior septal MI. He BMET    Component Value Date/Time   NA 142 05/05/2009 0808   K 4.1 05/05/2009 0808   CL 108 05/05/2009 0808   CO2 28 05/05/2009 0808   GLUCOSE 102* 05/05/2009 0808   BUN 21 05/05/2009 0808   CREATININE 0.8 05/05/2009 0808   CALCIUM 9.4 05/05/2009 0808   GFRNONAA 78.97 05/05/2009 0808   GFRAA 96 05/12/2007 1055    Lipid Panel     Component Value Date/Time   CHOL 159 05/05/2009 0808   TRIG 123.0 05/05/2009 0808   HDL 52.40 05/05/2009 0808   CHOLHDL 3 05/05/2009 0808   VLDL 24.6 05/05/2009 0808   LDLCALC 82 05/05/2009 0808    CBC  Component Value Date/Time   WBC 12.1* 04/02/2007 1550   RBC 4.55 04/02/2007 1550   HGB 14.8 04/02/2007 1550   HCT 43.0 04/02/2007 1550   PLT 284 04/02/2007 1550   MCV 94.5 04/02/2007 1550   MCHC 34.3 04/02/2007 1550   RDW 12.3 04/02/2007 1550   MONOABS 0.8* 04/02/2007 1550   EOSABS 0.4 04/02/2007 1550   BASOSABS 0.1 04/02/2007 1550

## 2011-02-20 NOTE — Patient Instructions (Addendum)
Your physician recommends that you return for fasting lab work - cholesterol level.  We will call you with your results.  Your physician wants you to follow-up in: 1 year with Dr. Daleen Squibb. You will receive a reminder letter in the mail two months in advance. If you don't receive a letter, please call our office to schedule the follow-up appointment.  Nitroglycerin sublingual tablets What is this medicine? NITROGLYCERIN (nye troe GLI ser in) is a type of vasodilator. It relaxes blood vessels, increasing the blood and oxygen supply to your heart. This medicine is used to relieve chest pain caused by angina. It is also used to prevent chest pain before activities like climbing stairs, going outdoors in cold weather, or sexual activity. This medicine may be used for other purposes; ask your health care provider or pharmacist if you have questions. What should I tell my health care provider before I take this medicine? They need to know if you have any of these conditions: -anemia -head injury, recent stroke, or bleeding in the brain -liver disease -previous heart attack -an unusual or allergic reaction to nitroglycerin, other medicines, foods, dyes, or preservatives -pregnant or trying to get pregnant -breast-feeding How should I use this medicine? Take this medicine by mouth as needed. At the first sign of an angina attack (chest pain or tightness) place one tablet under your tongue. You can also take this medicine 5 to 10 minutes before an event likely to produce chest pain. Follow the directions on the prescription label. Let the tablet dissolve under the tongue. Do not swallow whole. Replace the dose if you accidentally swallow it. It will help if your mouth is not dry. Saliva around the tablet will help it to dissolve more quickly. Do not eat or drink, smoke or chew tobacco while a tablet is dissolving.  Do not take more than 3 nitroglycerin tablets over 15 minutes. If you take this medicine often  to relieve symptoms of angina, your doctor or health care professional may provide you with different instructions to manage your symptoms. If symptoms do not go away after following these instructions, it is important to call 9-1-1 immediately. Do not take more than 3 nitroglycerin tablets over 15 minutes. Talk to your pediatrician regarding the use of this medicine in children. Special care may be needed. Overdosage: If you think you have taken too much of this medicine contact a poison control center or emergency room at once. NOTE: This medicine is only for you. Do not share this medicine with others. What if I miss a dose? This does not apply. This medicine is only used as needed. What may interact with this medicine? Do not take this medicine with any of the following medications: -certain migraine medicines like ergotamine and dihydroergotamine (DHE) -medicines used to treat erectile dysfunction like sildenafil, tadalafil, and vardenafil This medicine may also interact with the following medications: -alteplase -aspirin -heparin -medicines for high blood pressure -medicines for mental depression -other medicines used to treat angina -phenothiazines like chlorpromazine, mesoridazine, prochlorperazine, thioridazine This list may not describe all possible interactions. Give your health care provider a list of all the medicines, herbs, non-prescription drugs, or dietary supplements you use. Also tell them if you smoke, drink alcohol, or use illegal drugs. Some items may interact with your medicine. What should I watch for while using this medicine? Tell your doctor or health care professional if you feel your medicine is no longer working. Keep this medicine with you at all times. Sit  or lie down when you take your medicine to prevent falling if you feel dizzy or faint after using it. Try to remain calm. This will help you to feel better faster. If you feel dizzy, take several deep breaths  and lie down with your feet propped up, or bend forward with your head resting between your knees. You may get drowsy or dizzy. Do not drive, use machinery, or do anything that needs mental alertness until you know how this drug affects you. Do not stand or sit up quickly, especially if you are an older patient. This reduces the risk of dizzy or fainting spells. Alcohol can make you more drowsy and dizzy. Avoid alcoholic drinks. Do not treat yourself for coughs, colds, or pain while you are taking this medicine without asking your doctor or health care professional for advice. Some ingredients may increase your blood pressure. What side effects may I notice from receiving this medicine? Side effects that you should report to your doctor or health care professional as soon as possible: -blurred vision -dry mouth -skin rash -sweating -the feeling of extreme pressure in the head -unusually weak or tired Side effects that usually do not require medical attention (report to your doctor or health care professional if they continue or are bothersome): -flushing of the face or neck -headache -irregular heartbeat, palpitations -nausea, vomiting This list may not describe all possible side effects. Call your doctor for medical advice about side effects. You may report side effects to FDA at 1-800-FDA-1088. Where should I keep my medicine? Keep out of the reach of children. Store at room temperature between 20 and 25 degrees C (68 and 77 degrees F). Store in Retail buyer. Protect from light and moisture. Keep tightly closed. Throw away any unused medicine after the expiration date. NOTE: This sheet is a summary. It may not cover all possible information. If you have questions about this medicine, talk to your doctor, pharmacist, or health care provider.  2012, [employer]. (09/26/2007 5:16:24 PM)

## 2011-02-20 NOTE — Assessment & Plan Note (Signed)
Is stable. No change in treatment. We'll check fasting lipids and LFTs and give her a prescription for sublingual nitroglycerin. Instructions given for its use.

## 2011-02-20 NOTE — Assessment & Plan Note (Signed)
She normally runs about 120-130/70-80. I recheck it today before she left it was about 132/88. No change in treatment.

## 2011-02-20 NOTE — Assessment & Plan Note (Signed)
She has not had lipids checked in a couple of years. Fasting lipids and LFTs will be scheduled.

## 2011-03-01 ENCOUNTER — Other Ambulatory Visit: Payer: BC Managed Care – PPO | Admitting: *Deleted

## 2011-04-16 ENCOUNTER — Other Ambulatory Visit: Payer: Self-pay | Admitting: Cardiology

## 2011-05-22 ENCOUNTER — Other Ambulatory Visit: Payer: Self-pay | Admitting: Cardiology

## 2011-06-17 ENCOUNTER — Other Ambulatory Visit: Payer: Self-pay | Admitting: Cardiology

## 2011-06-21 ENCOUNTER — Other Ambulatory Visit: Payer: Self-pay | Admitting: Orthopaedic Surgery

## 2011-06-27 ENCOUNTER — Ambulatory Visit
Admission: RE | Admit: 2011-06-27 | Discharge: 2011-06-27 | Disposition: A | Discharge status: Home / Self Care | Payer: BC Managed Care – PPO | Source: Ambulatory Visit | Attending: Orthopaedic Surgery | Admitting: Orthopaedic Surgery

## 2011-06-27 DIAGNOSIS — Z780 Asymptomatic menopausal state: Secondary | ICD-10-CM

## 2011-10-20 ENCOUNTER — Other Ambulatory Visit: Payer: Self-pay | Admitting: Cardiology

## 2011-12-22 ENCOUNTER — Other Ambulatory Visit: Payer: Self-pay | Admitting: Cardiology

## 2012-01-23 ENCOUNTER — Other Ambulatory Visit: Payer: Self-pay | Admitting: Cardiology

## 2012-06-18 ENCOUNTER — Other Ambulatory Visit: Payer: Self-pay | Admitting: *Deleted

## 2012-06-18 MED ORDER — SIMVASTATIN 40 MG PO TABS: ORAL_TABLET | ORAL | Status: DC

## 2012-06-18 MED ORDER — CARVEDILOL 6.25 MG PO TABS: ORAL_TABLET | ORAL | Status: DC

## 2012-06-18 MED ORDER — LISINOPRIL 5 MG PO TABS: ORAL_TABLET | ORAL | Status: DC

## 2012-06-18 MED ORDER — NITROGLYCERIN 0.4 MG SL SUBL: 0.4000 mg | SUBLINGUAL_TABLET | SUBLINGUAL | Status: DC | PRN

## 2012-08-20 ENCOUNTER — Other Ambulatory Visit: Payer: Self-pay | Admitting: Cardiology

## 2012-09-08 ENCOUNTER — Other Ambulatory Visit: Payer: Self-pay | Admitting: *Deleted

## 2012-09-08 MED ORDER — CARVEDILOL 6.25 MG PO TABS: ORAL_TABLET | ORAL | Status: DC

## 2012-09-08 MED ORDER — SIMVASTATIN 40 MG PO TABS: ORAL_TABLET | ORAL | Status: DC

## 2012-09-08 MED ORDER — LISINOPRIL 5 MG PO TABS: ORAL_TABLET | ORAL | Status: DC

## 2012-09-29 ENCOUNTER — Encounter: Payer: Self-pay | Admitting: Physician Assistant

## 2012-09-29 ENCOUNTER — Ambulatory Visit (INDEPENDENT_AMBULATORY_CARE_PROVIDER_SITE_OTHER): Payer: BC Managed Care – PPO | Admitting: Physician Assistant

## 2012-09-29 VITALS — BP 138/82 | HR 77 | Ht 65.0 in | Wt 144.8 lb

## 2012-09-29 DIAGNOSIS — R42 Dizziness and giddiness: Secondary | ICD-10-CM

## 2012-09-29 DIAGNOSIS — I2510 Atherosclerotic heart disease of native coronary artery without angina pectoris: Secondary | ICD-10-CM

## 2012-09-29 DIAGNOSIS — I2589 Other forms of chronic ischemic heart disease: Secondary | ICD-10-CM

## 2012-09-29 DIAGNOSIS — E785 Hyperlipidemia, unspecified: Secondary | ICD-10-CM

## 2012-09-29 DIAGNOSIS — I10 Essential (primary) hypertension: Secondary | ICD-10-CM

## 2012-09-29 MED ORDER — LISINOPRIL 5 MG PO TABS: ORAL_TABLET | ORAL | Status: DC

## 2012-09-29 MED ORDER — SIMVASTATIN 40 MG PO TABS: ORAL_TABLET | ORAL | Status: DC

## 2012-09-29 MED ORDER — CARVEDILOL 6.25 MG PO TABS: ORAL_TABLET | ORAL | Status: DC

## 2012-09-29 NOTE — Patient Instructions (Addendum)
Need fasting lab work  Scheduled for 10/20/12   Your physician wants you to follow-up in: 12 months with Dr Theodoro Parma will receive a reminder letter in the mail two months in advance. If you don't receive a letter, please call our office to schedule the follow-up appointment.

## 2012-09-29 NOTE — Progress Notes (Signed)
[address], [address] [place], [place]  [address] Phone: [phone] Fax:  [phone]  Date:  09/29/2012   ID:  [name], DOB November 05, 1955, MRN [id number]  PCP:  Thane Edu, MD  Cardiologist:  Dr. Valera Castle     History of Present Illness: [name] is a 57 y.o. female who returns for f/u.  She has a hx of CAD, ICM, HTN, HL.  LHC 1/09:  pLAD occluded and filled by L-L and R-L collats, pCFX 20%, oOM1 50-60%, pRCA 60%, EF 45%.  She has been treated medically.  Echo 1/09: EF 40%, sept and apical AK, ant HK, trivial AI, mild LAE.  Last Myoview 2/11: EF 45%, ant-apical HK, ant-apical scar, no ischemia.  Last seen by Dr. Valera Castle 02/2011.  She is overall doing well. Exercises several times a week.  She has no PCP.  The patient denies chest pain, shortness of breath, syncope, orthopnea, PND or significant pedal edema.   Labs (2/11):  K 4.1, Cr 0.8, ALT 21, LDL 82   Wt Readings from Last 3 Encounters:  09/29/12 144 lb 12.8 oz (65.681 kg)  02/20/11 145 lb (65.772 kg)  05/05/09 149 lb (67.586 kg)     Past Medical History  Diagnosis Date  . Hyperlipidemia   . Hypertension   . Coronary artery disease     LHC 1/09:  pLAD occluded and filled by L-L and R-L collats, pCFX 20%, oOM1 50-60%, pRCA 60%, EF 45%.  She has been treated medically.  Echo 1/09: EF 40%, sept and apical AK, ant HK, trivial AI, mild LAE.  Last Myoview 2/11: EF 45%, ant-apical HK, ant-apical scar, no ischemia.     Current Outpatient Prescriptions  Medication Sig Dispense Refill  . aspirin 81 MG tablet Take 81 mg by mouth daily.        . carvedilol (COREG) 6.25 MG tablet TAKE 1 TABLET TWICE A DAY  60 tablet  11  . lisinopril (PRINIVIL,ZESTRIL) 5 MG tablet TAKE 1 TABLET BY MOUTH EVERY DAY  30 tablet  11  . nitroGLYCERIN (NITROSTAT) 0.4 MG SL tablet Place 1 tablet (0.4 mg total) under the tongue every 5 (five) minutes as needed for chest pain.  25 tablet  8  . simvastatin (ZOCOR) 40 MG  tablet TAKE 1 TABLET BY MOUTH AT BEDTIME  30 tablet  11   No current facility-administered medications for this visit.    Allergies:    Allergies  Allergen Reactions  . Penicillins   . Sulfonamide Derivatives     Social History:  The patient  reports that she has quit smoking. Her smoking use included Cigarettes. She smoked 0.00 packs per day. She has never used smokeless tobacco. She reports that  drinks alcohol. She reports that she does not use illicit drugs.   ROS:  Please see the history of present illness.   She has occasional dizziness described as spinning brought on by sudden change in head position.   All other systems reviewed and negative.   PHYSICAL EXAM: VS:  BP 138/82  Pulse 77  Ht 5\' 5"  (1.651 m)  Wt 144 lb 12.8 oz (65.681 kg)  BMI 24.1 kg/m2 Well nourished, well developed, in no acute distress HEENT: normal Neck: no JVD Vascular:  No carotid bruits Cardiac:  normal S1, S2; RRR; no murmur Lungs:  clear to auscultation bilaterally, no wheezing, rhonchi or rales Abd: soft, nontender, no hepatomegaly Ext: no edema Skin: warm and dry Neuro:  CNs  2-12 intact, no focal abnormalities noted  EKG:  NSR, HR 77, leftward axis, AS Q waves, lat TWI, no change from prior tracing     ASSESSMENT AND PLAN:  1. CAD:  Stable.  No angina.  Continue ASA and statin.  2. Ischemic Cardiomyopathy:  Essentially she is Stage B CHF with no clinical symptoms of CHF.  Continue current dose of beta blocker and ACE.  Check BMET. 3. Hypertension:  Controlled.  Continue current therapy.  4. Hyperlipidemia:  Continue statin.  Arrange f/u Lipids and LFTs. 5. Dizziness:  She is describing vertigo.  If her symptoms worsen, she could be referred to ENT or Physical Therapy. 6. Disposition:  She requests Dr. Tonny Bollman as her cardiologist in the future.  F/u in 1 year.   Signed, Tereso Newcomer, PA-C  09/29/2012 1:50 PM

## 2012-10-12 ENCOUNTER — Other Ambulatory Visit: Payer: Self-pay | Admitting: Cardiology

## 2013-10-08 ENCOUNTER — Other Ambulatory Visit: Payer: Self-pay | Admitting: Physician Assistant

## 2013-10-11 ENCOUNTER — Other Ambulatory Visit: Payer: Self-pay | Admitting: Physician Assistant

## 2013-11-10 ENCOUNTER — Other Ambulatory Visit: Payer: Self-pay

## 2013-11-10 MED ORDER — SIMVASTATIN 40 MG PO TABS: ORAL_TABLET | ORAL | Status: DC

## 2013-11-10 MED ORDER — LISINOPRIL 5 MG PO TABS: ORAL_TABLET | ORAL | Status: DC

## 2013-11-17 ENCOUNTER — Other Ambulatory Visit: Payer: Self-pay

## 2013-11-17 MED ORDER — LISINOPRIL 5 MG PO TABS: ORAL_TABLET | ORAL | Status: DC

## 2013-11-17 MED ORDER — CARVEDILOL 6.25 MG PO TABS: ORAL_TABLET | ORAL | Status: DC

## 2013-11-17 MED ORDER — SIMVASTATIN 40 MG PO TABS: ORAL_TABLET | ORAL | Status: DC

## 2013-11-30 ENCOUNTER — Ambulatory Visit (INDEPENDENT_AMBULATORY_CARE_PROVIDER_SITE_OTHER): Payer: BC Managed Care – PPO | Admitting: Physician Assistant

## 2013-11-30 ENCOUNTER — Encounter: Payer: Self-pay | Admitting: Physician Assistant

## 2013-11-30 VITALS — BP 135/74 | HR 67 | Ht >= 80 in | Wt 142.0 lb

## 2013-11-30 DIAGNOSIS — E785 Hyperlipidemia, unspecified: Secondary | ICD-10-CM

## 2013-11-30 DIAGNOSIS — R42 Dizziness and giddiness: Secondary | ICD-10-CM

## 2013-11-30 DIAGNOSIS — I2510 Atherosclerotic heart disease of native coronary artery without angina pectoris: Secondary | ICD-10-CM

## 2013-11-30 DIAGNOSIS — I10 Essential (primary) hypertension: Secondary | ICD-10-CM

## 2013-11-30 DIAGNOSIS — I252 Old myocardial infarction: Secondary | ICD-10-CM

## 2013-11-30 LAB — LIPID PANEL
CHOL/HDL RATIO: 4
CHOLESTEROL: 139 mg/dL (ref 0–200)
HDL: 37.5 mg/dL — ABNORMAL LOW (ref >39.00)
LDL Cholesterol: 79 mg/dL (ref 0–99)
NonHDL: 101.5
TRIGLYCERIDES: 113 mg/dL (ref 0.0–149.0)
VLDL: 22.6 mg/dL (ref 0.0–40.0)

## 2013-11-30 LAB — COMPREHENSIVE METABOLIC PANEL
ALT: 22 U/L (ref 0–35)
AST: 21 U/L (ref 0–37)
Albumin: 4 g/dL (ref 3.5–5.2)
Alkaline Phosphatase: 93 U/L (ref 39–117)
BUN: 21 mg/dL (ref 6–23)
CALCIUM: 9.3 mg/dL (ref 8.4–10.5)
CHLORIDE: 105 meq/L (ref 96–112)
CO2: 24 mEq/L (ref 19–32)
Creatinine, Ser: 0.9 mg/dL (ref 0.4–1.2)
GFR: 66.14 mL/min (ref >60.00)
Glucose, Bld: 99 mg/dL (ref 70–99)
POTASSIUM: 4.6 meq/L (ref 3.5–5.1)
Sodium: 138 mEq/L (ref 135–145)
Total Bilirubin: 0.6 mg/dL (ref 0.2–1.2)
Total Protein: 7.7 g/dL (ref 6.0–8.3)

## 2013-11-30 LAB — CBC WITH DIFFERENTIAL/PLATELET
Basophils Absolute: 0.1 10*3/uL (ref 0.0–0.1)
Basophils Relative: 0.8 % (ref 0.0–3.0)
EOS ABS: 0.5 10*3/uL (ref 0.0–0.7)
Eosinophils Relative: 5.3 % — ABNORMAL HIGH (ref 0.0–5.0)
HCT: 45.7 % (ref 36.0–46.0)
Hemoglobin: 15.2 g/dL — ABNORMAL HIGH (ref 12.0–15.0)
LYMPHS PCT: 38.5 % (ref 12.0–46.0)
Lymphs Abs: 3.8 10*3/uL (ref 0.7–4.0)
MCHC: 33.3 g/dL (ref 30.0–36.0)
MCV: 97.6 fl (ref 78.0–100.0)
Monocytes Absolute: 0.7 10*3/uL (ref 0.1–1.0)
Monocytes Relative: 7.1 % (ref 3.0–12.0)
Neutro Abs: 4.8 10*3/uL (ref 1.4–7.7)
Neutrophils Relative %: 48.3 % (ref 43.0–77.0)
Platelets: 237 10*3/uL (ref 150.0–400.0)
RBC: 4.68 Mil/uL (ref 3.87–5.11)
RDW: 13 % (ref 11.5–15.5)
WBC: 9.9 10*3/uL (ref 4.0–10.5)

## 2013-11-30 LAB — TSH: TSH: 0.51 u[IU]/mL (ref 0.35–4.50)

## 2013-11-30 MED ORDER — LISINOPRIL 5 MG PO TABS: ORAL_TABLET | ORAL | Status: DC

## 2013-11-30 MED ORDER — CARVEDILOL 6.25 MG PO TABS: ORAL_TABLET | ORAL | Status: DC

## 2013-11-30 MED ORDER — MECLIZINE HCL 12.5 MG PO TABS: 12.5000 mg | ORAL_TABLET | Freq: Three times a day (TID) | ORAL | Status: DC | PRN

## 2013-11-30 MED ORDER — SIMVASTATIN 40 MG PO TABS: ORAL_TABLET | ORAL | Status: DC

## 2013-11-30 NOTE — Assessment & Plan Note (Signed)
Controlled on current medications 

## 2013-11-30 NOTE — Assessment & Plan Note (Addendum)
Patient denies any symptoms of chest pain. She walks on a treadmill daily approximately 1 mile without symptoms. Continue medical therapy. We'll check labs today. Followup with Dr. Excell Seltzer in 6 months to become established.

## 2013-11-30 NOTE — Progress Notes (Signed)
HPI: This is a 58 year old female patient of Dr. wall who is to be established with Dr. Excell Seltzer. She has history of CAD, ischemic cardiomyopathy with LAD occlusion filled by left to left and right-to-left collaterals, circumflex 20%, OM1 50-60%, RCA 60%, EF 45% on cath in 2009. She's been treated medically. 2-D echo in 2009 EF 40% with septal and apical akinesis, anterior hypokinesis, trivial AI, mild left atrial enlargement. Last Myoview in 2011 EF 45% with anterior apical hypokinesis, anterior apical scar, no ischemia.  The patient comes in today for yearly followup. She denies any chest pain although she's never had chest pain. She denies dyspnea dyspnea on exertion palpitations or edema.  The patient does complain of dizziness. She gets dizzy if she gets up from laying down to a standing position.She also gets dizzy every time she takes a shower when she turns around. She used to take meclizine for short time and this helped. She no longer has a primary M.D.  Allergies  Allergen Reactions  . Penicillins   . Sulfonamide Derivatives      Current Outpatient Prescriptions  Medication Sig Dispense Refill  . aspirin 81 MG tablet Take 81 mg by mouth daily.        . carvedilol (COREG) 6.25 MG tablet TAKE 1 TABLET BY MOUTH TWICE DAILY  60 tablet  0  . lisinopril (PRINIVIL,ZESTRIL) 5 MG tablet TAKE 1 TABLET BY MOUTH ONCE DAILY  30 tablet  0  . simvastatin (ZOCOR) 40 MG tablet TAKE 1 TABLET BY MOUTH AT BEDTIME  30 tablet  0  . nitroGLYCERIN (NITROSTAT) 0.4 MG SL tablet Place 1 tablet (0.4 mg total) under the tongue every 5 (five) minutes as needed for chest pain.  25 tablet  8   No current facility-administered medications for this visit.    Past Medical History  Diagnosis Date  . Hyperlipidemia   . Hypertension   . Coronary artery disease     LHC 1/09:  pLAD occluded and filled by L-L and R-L collats, pCFX 20%, oOM1 50-60%, pRCA 60%, EF 45%.  She has been treated medically.  Echo  1/09: EF 40%, sept and apical AK, ant HK, trivial AI, mild LAE.  Last Myoview 2/11: EF 45%, ant-apical HK, ant-apical scar, no ischemia.     No past surgical history on file.  No family history on file.  History   Social History  . Marital Status: Married    Spouse Name: N/A    Number of Children: N/A  . Years of Education: N/A   Occupational History  .      full time   Social History Main Topics  . Smoking status: Former Smoker    Types: Cigarettes  . Smokeless tobacco: Never Used     Comment: quit 2008  . Alcohol Use: Yes     Comment: socially  . Drug Use: No  . Sexual Activity:    Other Topics Concern  . Not on file   Social History Narrative  . No narrative on file    ROS: See history of present illness otherwise negative  BP 128/78  Pulse 68  Ht 7' (2.134 m)  Wt 64.411 kg (142 lb)  BMI 14.14 kg/m2  PHYSICAL EXAM: Well-nournished, in no acute distress. Neck: No JVD, HJR, Bruit, or thyroid enlargement  Lungs: No tachypnea, clear without wheezing, rales, or rhonchi  Cardiovascular: RRR, PMI not displaced, heart sounds normal, no murmurs, gallops, bruit, thrill, or heave.  Abdomen: BS  normal. Soft without organomegaly, masses, lesions or tenderness.  Extremities: without cyanosis, clubbing or edema. Good distal pulses bilateral  SKin: Warm, no lesions or rashes   Musculoskeletal: No deformities  Neuro: no focal signs   Wt Readings from Last 3 Encounters:  11/30/13 64.411 kg (142 lb)  09/29/12 65.681 kg (144 lb 12.8 oz)  02/20/11 65.772 kg (145 lb)     EKG: Normal sinus rhythm with T wave inversion anteriorly laterally a little bit more prominent in leads V2 and V3 but not as prominent in V5 than prior EKGs

## 2013-11-30 NOTE — Assessment & Plan Note (Signed)
Check fasting lipid panel 

## 2013-11-30 NOTE — Assessment & Plan Note (Signed)
Patient gets dizzy when she changes positions from lying to standing or when she turns around in the shower. Her blood pressure drops a little in the office with lying to standing from 135-125 systolic. I suspect she has a touch of vertigo she's had in the past. We'll give her trial of meclizine 12.5 mg 3 times a day when necessary. Recommend establishing with primary care [facility]. Have referred her.

## 2013-11-30 NOTE — Patient Instructions (Addendum)
You have been referred to [facility] [place]  Your physician has recommended you make the following change in your medication:  1. Start Meclizine 12.5 mg 1 tablet three times daily as needed for dizziness  Your physician recommends that you have for lab work today: CBC with Platelets, TSH, LIPID,CMET     Your physician wants you to follow-up in: 6 months with DR. Cooper (Note: Pt needs to see Physician instead of PA)  You will receive a reminder letter in the mail two months in advance. If you don't receive a letter, please call our office to schedule the follow-up appointment.

## 2013-12-08 ENCOUNTER — Telehealth: Payer: Self-pay | Admitting: Physician Assistant

## 2013-12-09 ENCOUNTER — Telehealth: Payer: Self-pay | Admitting: Physician Assistant

## 2013-12-09 NOTE — Telephone Encounter (Signed)
Tried calling patient about vertigo, but no answer and couldn't leave a message

## 2013-12-09 NOTE — Telephone Encounter (Signed)
Patient states she didn't get Michele's call at 7:55 this a.m. Our conversation was mostly about what is already in previous note. She states she doesn't know when she will get to see a cardiologist. Thinks it is Dr. Excell Seltzer. States she will not take Meclizine again and want to know if there is anything else available until she sees her PCP.

## 2013-12-09 NOTE — Telephone Encounter (Signed)
Patient informed. May try Benadryl, but wants to check to see what Dr. Algis Greenhouse gave her years ago.

## 2013-12-09 NOTE — Telephone Encounter (Signed)
New message  Pt called states that she has a concern about her quality of care. Pt states instead of seeing her doctor she had to see a PA that wrote her a script for dizziness. Pt reports she is dizzy and she doesn't know why. Pt also says she spoke with the nurse and received 5 different phone calls to give the results. Was supposed to receive a call from the PA-Lenze herself and has not received a call back in two days.   Pt also reports she has a problem with the medication she was given the Meclizine, which made her sleep from 10pm on saturday to 7am Monday morning. She slept 30 hours from 1 pill . Pt says whoever relayed the initial message relayed it wrong. Pt requests a call back to discuss.

## 2013-12-09 NOTE — Telephone Encounter (Signed)
Please let patient know that we don't typically treat vertigo. She could take Benadryl, but this also may make her drowsy. If she continues to have vertigo & can't wait to see her primary MD Oct 12th she could go to a [facility], urgent care, or ER.

## 2013-12-28 ENCOUNTER — Ambulatory Visit: Payer: BC Managed Care – PPO | Admitting: Family

## 2014-01-01 ENCOUNTER — Other Ambulatory Visit: Payer: Self-pay | Admitting: *Deleted

## 2014-01-01 DIAGNOSIS — I10 Essential (primary) hypertension: Secondary | ICD-10-CM

## 2014-01-01 DIAGNOSIS — I252 Old myocardial infarction: Secondary | ICD-10-CM

## 2014-01-01 MED ORDER — SIMVASTATIN 40 MG PO TABS: 40.0000 mg | ORAL_TABLET | Freq: Every day | ORAL | Status: DC

## 2014-01-01 MED ORDER — LISINOPRIL 5 MG PO TABS: ORAL_TABLET | ORAL | Status: DC

## 2014-01-01 MED ORDER — CARVEDILOL 6.25 MG PO TABS: ORAL_TABLET | ORAL | Status: DC

## 2014-01-26 ENCOUNTER — Ambulatory Visit: Payer: BC Managed Care – PPO | Admitting: Family

## 2014-04-15 ENCOUNTER — Telehealth: Payer: Self-pay | Admitting: Cardiovascular Disease

## 2014-04-15 NOTE — Telephone Encounter (Signed)
I attempted to reach the pt but someone picked up the phone and then hung up.  I dialed the number again and the message was asking for a system code.

## 2014-04-15 NOTE — Telephone Encounter (Signed)
New Message  Pt was called for a second time (first time in Dec, where pt declined to make appt) for a recall for March, Pt was told Dr. Randolm Idoloopers next available appt was in April. Pt requested to speak w/ Rn about her medications. Please call back and discuss.

## 2014-04-27 NOTE — Telephone Encounter (Signed)
I spoke with the pt and have her scheduled to see Dr Excell Seltzerooper 06/03/14.  The pt will need refills during this appointment.

## 2014-06-03 ENCOUNTER — Ambulatory Visit (INDEPENDENT_AMBULATORY_CARE_PROVIDER_SITE_OTHER): Payer: BLUE CROSS/BLUE SHIELD | Admitting: Cardiovascular Disease

## 2014-06-03 ENCOUNTER — Encounter: Payer: Self-pay | Admitting: Cardiovascular Disease

## 2014-06-03 VITALS — BP 134/84 | HR 76 | Ht 61.0 in | Wt 142.8 lb

## 2014-06-03 DIAGNOSIS — I252 Old myocardial infarction: Secondary | ICD-10-CM

## 2014-06-03 DIAGNOSIS — I10 Essential (primary) hypertension: Secondary | ICD-10-CM

## 2014-06-03 DIAGNOSIS — I2510 Atherosclerotic heart disease of native coronary artery without angina pectoris: Secondary | ICD-10-CM

## 2014-06-03 DIAGNOSIS — E785 Hyperlipidemia, unspecified: Secondary | ICD-10-CM

## 2014-06-03 MED ORDER — LISINOPRIL 5 MG PO TABS: ORAL_TABLET | ORAL | Status: DC

## 2014-06-03 MED ORDER — SIMVASTATIN 40 MG PO TABS: 40.0000 mg | ORAL_TABLET | Freq: Every day | ORAL | Status: DC

## 2014-06-03 MED ORDER — CARVEDILOL 6.25 MG PO TABS: ORAL_TABLET | ORAL | Status: DC

## 2014-06-03 MED ORDER — NITROGLYCERIN 0.4 MG SL SUBL: 0.4000 mg | SUBLINGUAL_TABLET | SUBLINGUAL | Status: DC | PRN

## 2014-06-03 NOTE — Progress Notes (Signed)
Cardiology Office Note   Date:  06/04/2014   ID:  [name], DOB [birth year], MRN [id number]  PCP:  Thane Edu, MD  Cardiologist:  Tonny Bollman, MD    No chief complaint on file.   History of Present Illness: [name] is a 59 y.o. female who presents for follow-up of CAD. She's been followed by Dr Daleen Squibb - this is her first visit with me.   The patient was diagnosed with coronary artery disease in 2009. The patient had complained of fatigue and an echocardiogram was performed. This demonstrated septal and apical akinesis and anterior wall hypokinesis. The left ventricle was moderately dilated and her LVEF by echo was estimated at 40%. She had no anginal symptoms at the time, but ultimately was diagnosed with multivessel CAD. She was found to have total occlusion of the LAD with left to left and right-to-left collaterals. She also had moderate disease of the left circumflex and right coronary arteries with 60% lesions in both vessels.   The patient is currently doing well. She is physically active without symptoms of chest pain, chest pressure, shortness of breath, leg swelling, lightheadedness, or dizziness. She is tolerating her medical program without difficulty.  Walks 30 minutes on the treadmill regularly 3-4 days/week without symptoms.   Past Medical History  Diagnosis Date  . Hyperlipidemia   . Hypertension   . Coronary artery disease     LHC 1/09:  pLAD occluded and filled by L-L and R-L collats, pCFX 20%, oOM1 50-60%, pRCA 60%, EF 45%.  She has been treated medically.  Echo 1/09: EF 40%, sept and apical AK, ant HK, trivial AI, mild LAE.  Last Myoview 2/11: EF 45%, ant-apical HK, ant-apical scar, no ischemia.     No past surgical history on file.  Current Outpatient Prescriptions  Medication Sig Dispense Refill  . aspirin 81 MG tablet Take 81 mg by mouth daily.      . carvedilol (COREG) 6.25 MG tablet TAKE 1 TABLET BY MOUTH TWICE DAILY 60 tablet 11  .  lisinopril (PRINIVIL,ZESTRIL) 5 MG tablet TAKE 1 TABLET BY MOUTH ONCE DAILY 30 tablet 11  . nitroGLYCERIN (NITROSTAT) 0.4 MG SL tablet Place 1 tablet (0.4 mg total) under the tongue every 5 (five) minutes as needed for chest pain. 25 tablet 2  . simvastatin (ZOCOR) 40 MG tablet Take 1 tablet (40 mg total) by mouth at bedtime. 30 tablet 11   No current facility-administered medications for this visit.    Allergies:   Meclizine hcl; Penicillins; and Sulfonamide derivatives   Social History:  The patient  reports that she has quit smoking. Her smoking use included Cigarettes. She has never used smokeless tobacco. She reports that she drinks alcohol. She reports that she does not use illicit drugs.   Family History:  The patient's  family history includes Bipolar disorder in her mother; Cancer in her father; Congestive Heart Failure in her father; Dementia in her maternal aunt and mother; Diabetes in her father and maternal grandmother; Heart disease in her father; Parkinson's disease in her maternal aunt; Schizophrenia in her mother.    ROS:  Please see the history of present illness.  Otherwise, review of systems is positive for leg cramps.  All other systems are reviewed and negative.    PHYSICAL EXAM: VS:  BP 134/84 mmHg  Pulse 76  Ht  (1.549 m)  Wt 142 lb 12.8 oz (64.774 kg)  BMI 27.00 kg/m2 , BMI Body mass index is  27 kg/(m^2). GEN: Well nourished, well developed, in no acute distress HEENT: normal Neck: no JVD, no masses. No carotid bruits Cardiac: RRR without murmur or gallop                Respiratory:  clear to auscultation bilaterally, normal work of breathing GI: soft, nontender, nondistended, + BS MS: no deformity or atrophy Ext: no pretibial edema, pedal pulses 2+= bilaterally Skin: warm and dry, no rash Neuro:  Strength and sensation are intact Psych: euthymic mood, full affect  EKG:  EKG is ordered today. The ekg ordered today shows NSR 76 bpm, age indeterminate  septal infarct, T wave abnormality consider lateral ischemia.  Recent Labs: 11/30/2013: ALT 22; BUN 21; Creatinine 0.9; Hemoglobin 15.2*; Platelets 237.0; Potassium 4.6; Sodium 138; TSH 0.51   Lipid Panel     Component Value Date/Time   CHOL 139 11/30/2013 1149   TRIG 113.0 11/30/2013 1149   HDL 37.50* 11/30/2013 1149   CHOLHDL 4 11/30/2013 1149   VLDL 22.6 11/30/2013 1149   LDLCALC 79 11/30/2013 1149   LDLDIRECT 203.2 05/12/2007 1055      Wt Readings from Last 3 Encounters:  06/03/14 142 lb 12.8 oz (64.774 kg)  11/30/13 142 lb (64.411 kg)  09/29/12 144 lb 12.8 oz (65.681 kg)     Cardiac Studies Reviewed: 2-D echocardiogram 03/26/2007: SUMMARY - Septal and apical akinesis anterior hyplinesis The left ventricle    was moderately dilated. Overall left ventricular systolic    function was moderately decreased. Left ventricular ejection    fraction was estimated to be 40 %. - The aortic valve was mildly calcified. There was trivial aortic    valvular regurgitation. - The left atrium was mildly dilated. - Regionality of wall motion abnormality consistant with previous    M.I.  IMPRESSIONS - Regionality of wall motion abnormality consistant with previous    M.I.  ASSESSMENT AND PLAN: 1.  CAD, native vessel, without symptoms of angina. The patient seems to be doing well on her current medical program. However, she was asymptomatic at presentation with her coronary diagnosis. She has fairly high risk coronary anatomy with total occlusion of her LAD and moderate stenosis of the right coronary artery and left circumflex from cardiac catheterization now 7 years ago. I have recommended an exercise Myoview stress test for further risk stratification since she was initially asymptomatic.  2. Ischemic cardiomyopathy: Appears stable on carvedilol and lisinopril. Will be able to assess LVEF by nuclear scan. Previously with moderate LV systolic dysfunction.  She has New York Heart Association functional class I symptoms.  3. Hyperlipidemia: Patient takes simvastatin.  Lipids are well controlled with a total cholesterol 139 and LDL of 79 on most recent labs.   Current medicines are reviewed with the patient today.  The patient does not have concerns regarding medicines.  The following changes have been made:  no change  Labs/ tests ordered today include:   Orders Placed This Encounter  Procedures  . Myocardial Perfusion Imaging  . EKG 12-Lead    Disposition:   FU one year  Signed, Tonny BollmanMichael Ellesse Antenucci, MD  06/04/2014 11:08 AM    [facility] [address], [place], [place]  [address] Phone: [phone](336) [phone]; Fax: [phone](336) [phone]

## 2014-06-03 NOTE — Patient Instructions (Addendum)
Your physician has requested that you have an exercise stress myoview (pt was given myoview instructions and billing sheet during appointment). For further information please visit [email or web address]. Please follow instruction sheet, as given.  Your physician recommends that you continue on your current medications as directed. Please refer to the Current Medication list given to you today.  Your physician wants you to follow-up in:  1 YEAR with Dr Excell Seltzerooper.  You will receive a reminder letter in the mail two months in advance. If you don't receive a letter, please call our office to schedule the follow-up appointment.

## 2014-06-04 ENCOUNTER — Telehealth: Payer: Self-pay

## 2014-06-04 NOTE — Telephone Encounter (Signed)
Per [employer] in billing/coding I looked up her plan for this year and she has a 3,000 deductible and once she meets that she has a $5,450 out of pocket. She will probably end up paying the full amt for this test but if she has anymore medical this year at least she has met most of her insurance requirements. Her premiums are probably low but its a tradeoff for the actual benefits that are paid out.    Please have pt call her insurance and tell them it will be "outpt hospital" and use the CPT code [place] will authorize myoview. I spoke with the pt and made her aware of this information. At this time the pt does not want to proceed with myoview due to out of pocket expense. I gave the pt the information that she would need to contact the insurance company to verify above information.  If the pt decides to have myoview performed then she will contact our office.

## 2015-03-03 ENCOUNTER — Other Ambulatory Visit: Payer: Self-pay | Admitting: *Deleted

## 2015-03-03 DIAGNOSIS — I252 Old myocardial infarction: Secondary | ICD-10-CM

## 2015-03-03 DIAGNOSIS — I2510 Atherosclerotic heart disease of native coronary artery without angina pectoris: Secondary | ICD-10-CM

## 2015-03-03 DIAGNOSIS — I10 Essential (primary) hypertension: Secondary | ICD-10-CM

## 2015-03-03 MED ORDER — LISINOPRIL 5 MG PO TABS: ORAL_TABLET | ORAL | Status: DC

## 2015-06-09 ENCOUNTER — Other Ambulatory Visit: Payer: Self-pay | Admitting: Cardiovascular Disease

## 2015-07-06 ENCOUNTER — Other Ambulatory Visit: Payer: Self-pay | Admitting: Cardiovascular Disease

## 2015-07-12 ENCOUNTER — Other Ambulatory Visit: Payer: Self-pay | Admitting: Cardiovascular Disease

## 2015-08-17 ENCOUNTER — Other Ambulatory Visit: Payer: Self-pay | Admitting: Cardiovascular Disease

## 2015-08-18 ENCOUNTER — Other Ambulatory Visit: Payer: Self-pay | Admitting: *Deleted

## 2015-08-18 MED ORDER — LISINOPRIL 5 MG PO TABS: 5.0000 mg | ORAL_TABLET | Freq: Every day | ORAL | Status: DC

## 2015-08-18 MED ORDER — SIMVASTATIN 40 MG PO TABS: 40.0000 mg | ORAL_TABLET | Freq: Every day | ORAL | Status: DC

## 2015-10-07 ENCOUNTER — Ambulatory Visit (INDEPENDENT_AMBULATORY_CARE_PROVIDER_SITE_OTHER): Payer: PRIVATE HEALTH INSURANCE | Admitting: Cardiovascular Disease

## 2015-10-07 ENCOUNTER — Encounter (INDEPENDENT_AMBULATORY_CARE_PROVIDER_SITE_OTHER): Payer: Self-pay

## 2015-10-07 ENCOUNTER — Encounter: Payer: Self-pay | Admitting: Cardiovascular Disease

## 2015-10-07 VITALS — BP 140/96 | HR 72 | Ht 61.0 in | Wt 143.0 lb

## 2015-10-07 DIAGNOSIS — E785 Hyperlipidemia, unspecified: Secondary | ICD-10-CM

## 2015-10-07 DIAGNOSIS — I252 Old myocardial infarction: Secondary | ICD-10-CM | POA: Diagnosis not present

## 2015-10-07 DIAGNOSIS — I2510 Atherosclerotic heart disease of native coronary artery without angina pectoris: Secondary | ICD-10-CM

## 2015-10-07 DIAGNOSIS — I10 Essential (primary) hypertension: Secondary | ICD-10-CM

## 2015-10-07 LAB — LIPID PANEL
CHOL/HDL RATIO: 3 ratio (ref <=5.0)
CHOLESTEROL: 146 mg/dL (ref 125–200)
HDL: 48 mg/dL (ref >=46)
LDL Cholesterol: 75 mg/dL (ref <130)
TRIGLYCERIDES: 117 mg/dL (ref <150)
VLDL: 23 mg/dL (ref <30)

## 2015-10-07 LAB — HEPATIC FUNCTION PANEL
ALT: 13 U/L (ref 6–29)
AST: 16 U/L (ref 10–35)
Albumin: 4.1 g/dL (ref 3.6–5.1)
Alkaline Phosphatase: 96 U/L (ref 33–130)
BILIRUBIN INDIRECT: 0.4 mg/dL (ref 0.2–1.2)
Bilirubin, Direct: 0.1 mg/dL (ref <=0.2)
TOTAL PROTEIN: 6.9 g/dL (ref 6.1–8.1)
Total Bilirubin: 0.5 mg/dL (ref 0.2–1.2)

## 2015-10-07 MED ORDER — LISINOPRIL 5 MG PO TABS: 5.0000 mg | ORAL_TABLET | Freq: Every day | ORAL | Status: DC

## 2015-10-07 MED ORDER — SIMVASTATIN 40 MG PO TABS: 40.0000 mg | ORAL_TABLET | Freq: Every day | ORAL | Status: DC

## 2015-10-07 MED ORDER — CARVEDILOL 6.25 MG PO TABS: ORAL_TABLET | ORAL | Status: DC

## 2015-10-07 NOTE — Patient Instructions (Signed)
Medication Instructions:  Your physician recommends that you continue on your current medications as directed. Please refer to the Current Medication list given to you today.  Labwork: Your physician recommends that you have lab work today: LIPID and LIVER  Testing/Procedures: No new orders.   Follow-Up: Your physician wants you to follow-up in: 1 YEAR with Dr Cooper.  You will receive a reminder letter in the mail two months in advance. If you don't receive a letter, please call our office to schedule the follow-up appointment.   Any Other Special Instructions Will Be Listed Below (If Applicable).     If you need a refill on your cardiac medications before your next appointment, please call your pharmacy.   

## 2015-10-07 NOTE — Progress Notes (Signed)
Cardiology Office Note Date:  10/07/2015   ID:  [name], DOB [birth year], MRN [id number]  PCP:  Thane Edu, MD  Cardiologist:  Tonny Bollman, MD    No chief complaint on file.    History of Present Illness: [name] is a 60 y.o. female who presents for follow-up evaluation. She was last seen in March 2016. The patient was diagnosed with coronary artery disease in 2009. She has EKG and echo findings consistent with an old anteroapical MI. She ultimately underwent cardiac catheterization demonstrating total occlusion of the LAD filled by left to left and right to left collaterals. She was also noted to have moderate disease of the left circumflex and right coronary arteries. She has been managed medically without any recurrent angina.  The patient is here alone today. She feels great. She denies any symptoms of chest pain, shortness of breath, lightheadedness, or heart palpitations. She is physically active and follows a healthy diet.   Past Medical History  Diagnosis Date  . Hyperlipidemia   . Hypertension   . Coronary artery disease     LHC 1/09:  pLAD occluded and filled by L-L and R-L collats, pCFX 20%, oOM1 50-60%, pRCA 60%, EF 45%.  She has been treated medically.  Echo 1/09: EF 40%, sept and apical AK, ant HK, trivial AI, mild LAE.  Last Myoview 2/11: EF 45%, ant-apical HK, ant-apical scar, no ischemia.     History reviewed. No pertinent past surgical history.  Current Outpatient Prescriptions  Medication Sig Dispense Refill  . aspirin 81 MG tablet Take 81 mg by mouth daily.      . carvedilol (COREG) 6.25 MG tablet TAKE 1 TABLET BY MOUTH TWICE DAILY 60 tablet 11  . lisinopril (PRINIVIL,ZESTRIL) 5 MG tablet Take 1 tablet (5 mg total) by mouth daily. Please keep 10/07/15 appointment 90 tablet 0  . nitroGLYCERIN (NITROSTAT) 0.4 MG SL tablet Place 1 tablet (0.4 mg total) under the tongue every 5 (five) minutes as needed for chest pain. 25 tablet 2  .  simvastatin (ZOCOR) 40 MG tablet Take 1 tablet (40 mg total) by mouth at bedtime. Please keep 10/07/15 appointment 90 tablet 0   No current facility-administered medications for this visit.    Allergies:   Meclizine hcl; Penicillins; and Sulfonamide derivatives   Social History:  The patient  reports that she has quit smoking. Her smoking use included Cigarettes. She has never used smokeless tobacco. She reports that she drinks alcohol. She reports that she does not use illicit drugs.   Family History:  The patient's  family history includes Bipolar disorder in her mother; Cancer in her father; Congestive Heart Failure in her father; Dementia in her maternal aunt and mother; Diabetes in her father and maternal grandmother; Heart disease in her father; Parkinson's disease in her maternal aunt; Schizophrenia in her mother.    ROS:  Please see the history of present illness.  Otherwise, review of systems is positive for cough, dizziness, easy bruising, joint swelling.  All other systems are reviewed and negative.    PHYSICAL EXAM: VS:  BP 140/96 mmHg  Pulse 72  Ht  (1.549 m)  Wt 143 lb (64.864 kg)  BMI 27.03 kg/m2 , BMI Body mass index is 27.03 kg/(m^2). GEN: Well nourished, well developed, in no acute distress HEENT: normal Neck: no JVD, no masses. No carotid bruits Cardiac: RRR without murmur or gallop  Respiratory:  clear to auscultation bilaterally, normal work of breathing GI: soft, nontender, nondistended, + BS MS: no deformity or atrophy Ext: no pretibial edema, pedal pulses 2+= bilaterally Skin: warm and dry, no rash Neuro:  Strength and sensation are intact Psych: euthymic mood, full affect  EKG:  EKG is ordered today. The ekg ordered today shows normal sinus rhythm 72 bpm, possible anteroseptal infarct age undetermined, ST and T-wave abnormality consider lateral ischemia, no change from previous tracings  Recent Labs: No results found for requested labs  within last 365 days.   Lipid Panel     Component Value Date/Time   CHOL 139 11/30/2013 1149   TRIG 113.0 11/30/2013 1149   HDL 37.50* 11/30/2013 1149   CHOLHDL 4 11/30/2013 1149   VLDL 22.6 11/30/2013 1149   LDLCALC 79 11/30/2013 1149   LDLDIRECT 203.2 05/12/2007 1055      Wt Readings from Last 3 Encounters:  10/07/15 143 lb (64.864 kg)  06/03/14 142 lb 12.8 oz (64.774 kg)  11/30/13 142 lb (64.411 kg)     Cardiac Studies Reviewed: 2D Echo 2009: SUMMARY - Septal and apical akinesis anterior hyplinesis The left ventricle    was moderately dilated. Overall left ventricular systolic    function was moderately decreased. Left ventricular ejection    fraction was estimated to be 40 %. - The aortic valve was mildly calcified. There was trivial aortic    valvular regurgitation. - The left atrium was mildly dilated. - Regionality of wall motion abnormality consistant with previous    M.I.  IMPRESSIONS - Regionality of wall motion abnormality consistant with previous    M.I.  ASSESSMENT AND PLAN: 1.  CAD, native vessel, with old MI: The patient has no symptoms of angina. She is tolerating medical therapy with no functional limitation. Last year we discussed undergoing a nuclear stress test because of her known occluded LAD and moderate left circumflex and RCA disease. She ultimately declined because of cost issues. She continues to do well and will be managed with medical therapy and observation. I will see her back in one year.  2. Ischemic cardiomyopathy: Stable on carvedilol and lisinopril. NYHA functional class I symptoms.  3. Hyperlipidemia: The patient is treated with simvastatin. Last lipids are reviewed. Will arrange fasting labs.  Current medicines are reviewed with the patient today.  The patient does not have concerns regarding medicines.  Labs/ tests ordered today include:  No orders of the defined types were placed in this  encounter.    Disposition:   FU one year  Signed, Tonny Bollmanooper, Chibueze Beasley, MD  10/07/2015 10:07 AM    [facility] [address], [place], [place]  [address] Phone: [phone](336) [phone]; Fax: [phone](336) [phone]

## 2016-11-07 ENCOUNTER — Other Ambulatory Visit: Payer: Self-pay | Admitting: *Deleted

## 2016-11-07 DIAGNOSIS — I2510 Atherosclerotic heart disease of native coronary artery without angina pectoris: Secondary | ICD-10-CM

## 2016-11-07 DIAGNOSIS — I252 Old myocardial infarction: Secondary | ICD-10-CM

## 2016-11-07 DIAGNOSIS — I10 Essential (primary) hypertension: Secondary | ICD-10-CM

## 2016-11-07 MED ORDER — CARVEDILOL 6.25 MG PO TABS: ORAL_TABLET | ORAL | 0 refills | Status: DC

## 2016-11-28 ENCOUNTER — Telehealth: Payer: Self-pay | Admitting: Physician Assistant

## 2016-11-28 ENCOUNTER — Encounter: Payer: Self-pay | Admitting: Physician Assistant

## 2016-11-28 ENCOUNTER — Encounter: Payer: PRIVATE HEALTH INSURANCE | Admitting: Physician Assistant

## 2016-11-28 VITALS — BP 132/87 | HR 72 | Temp 98.0°F | Resp 18 | Ht 61.0 in | Wt 142.6 lb

## 2016-11-28 NOTE — Patient Instructions (Addendum)
We recommend that you schedule a mammogram for breast cancer screening. Typically, you do not need a referral to do this. Please contact a local imaging center to schedule your mammogram.  [facility] - [phone]  *ask for the Radiology Department [facility] - [phone] or [phone]  [facility] - [phone] [facility] - [phone] [facility] - [phone]  *ask for the Radiology Department [facility] - [phone]  *ask for the Radiology Department [facility] - [phone]  *ask for the Mammography Department [facility] - [phone]     IF you received an x-ray today, you will receive an invoice from [facility]. Please contact [facility] at [phone] with questions or concerns regarding your invoice.   IF you received labwork today, you will receive an invoice from [facility]. Please contact [facility] at [phone] with questions or concerns regarding your invoice.   Our billing staff will not be able to assist you with questions regarding bills from these companies.  You will be contacted with the lab results as soon as they are available. The fastest way to get your results is to activate your My Chart account. Instructions are located on the last page of this paperwork. If you have not heard from us regarding the results in 2 weeks, please contact this office.      

## 2016-11-28 NOTE — Telephone Encounter (Signed)
Please contact this patient to reschedule with me. After waiting for 1 hour, she left today. Her brother, who is also my patient, sent me a message in My Chart, asking that we reschedule her.

## 2016-11-30 NOTE — Progress Notes (Signed)
This encounter was created in error - please disregard.

## 2016-12-10 ENCOUNTER — Other Ambulatory Visit: Payer: Self-pay | Admitting: Cardiovascular Disease

## 2016-12-12 ENCOUNTER — Ambulatory Visit (INDEPENDENT_AMBULATORY_CARE_PROVIDER_SITE_OTHER): Payer: PRIVATE HEALTH INSURANCE | Admitting: Physician Assistant

## 2016-12-12 ENCOUNTER — Encounter: Payer: Self-pay | Admitting: Physician Assistant

## 2016-12-12 VITALS — BP 110/74 | HR 77 | Temp 98.2°F | Resp 16 | Ht 61.0 in | Wt 144.6 lb

## 2016-12-12 DIAGNOSIS — I10 Essential (primary) hypertension: Secondary | ICD-10-CM | POA: Diagnosis not present

## 2016-12-12 DIAGNOSIS — S90851A Superficial foreign body, right foot, initial encounter: Secondary | ICD-10-CM | POA: Diagnosis not present

## 2016-12-12 DIAGNOSIS — E2839 Other primary ovarian failure: Secondary | ICD-10-CM

## 2016-12-12 DIAGNOSIS — K5730 Diverticulosis of large intestine without perforation or abscess without bleeding: Secondary | ICD-10-CM | POA: Insufficient documentation

## 2016-12-12 DIAGNOSIS — Z1231 Encounter for screening mammogram for malignant neoplasm of breast: Secondary | ICD-10-CM | POA: Diagnosis not present

## 2016-12-12 DIAGNOSIS — I252 Old myocardial infarction: Secondary | ICD-10-CM | POA: Diagnosis not present

## 2016-12-12 DIAGNOSIS — Z1211 Encounter for screening for malignant neoplasm of colon: Secondary | ICD-10-CM | POA: Diagnosis not present

## 2016-12-12 DIAGNOSIS — K449 Diaphragmatic hernia without obstruction or gangrene: Secondary | ICD-10-CM | POA: Diagnosis not present

## 2016-12-12 DIAGNOSIS — I2510 Atherosclerotic heart disease of native coronary artery without angina pectoris: Secondary | ICD-10-CM | POA: Diagnosis not present

## 2016-12-12 DIAGNOSIS — E785 Hyperlipidemia, unspecified: Secondary | ICD-10-CM

## 2016-12-12 DIAGNOSIS — M1990 Unspecified osteoarthritis, unspecified site: Secondary | ICD-10-CM | POA: Insufficient documentation

## 2016-12-12 DIAGNOSIS — Z23 Encounter for immunization: Secondary | ICD-10-CM | POA: Diagnosis not present

## 2016-12-12 DIAGNOSIS — R42 Dizziness and giddiness: Secondary | ICD-10-CM | POA: Diagnosis not present

## 2016-12-12 DIAGNOSIS — Z7689 Persons encountering health services in other specified circumstances: Secondary | ICD-10-CM | POA: Diagnosis not present

## 2016-12-12 MED ORDER — MECLIZINE HCL 12.5 MG PO TABS: 12.5000 mg | ORAL_TABLET | Freq: Three times a day (TID) | ORAL | 99 refills | Status: AC | PRN

## 2016-12-12 MED ORDER — NITROGLYCERIN 0.4 MG SL SUBL: 0.4000 mg | SUBLINGUAL_TABLET | SUBLINGUAL | 99 refills | Status: AC | PRN

## 2016-12-12 MED ORDER — SIMVASTATIN 40 MG PO TABS: ORAL_TABLET | ORAL | 3 refills | Status: AC

## 2016-12-12 MED ORDER — LISINOPRIL 5 MG PO TABS: ORAL_TABLET | ORAL | 3 refills | Status: AC

## 2016-12-12 MED ORDER — CARVEDILOL 6.25 MG PO TABS: ORAL_TABLET | ORAL | 3 refills | Status: AC

## 2016-12-12 NOTE — Patient Instructions (Addendum)
WOUND CARE . Keep area clean and dry for 24 hours.  . After 24 hours, remove bandage and wash wound gently with mild soap and warm water. Reapply a new bandage after cleaning wound, if directed. . Continue daily cleansing with soap and water until stitches/staples are removed. . You may apply antibiotic ointments or creams to the wound . Notify the office if you experience any of the following signs of infection: Swelling, redness, pus drainage, streaking, fever >101.0 F . Notify the office if you experience excessive bleeding that does not stop after 15-20 minutes of constant, firm pressure.      IF you received an x-ray today, you will receive an invoice from [facility]. Please contact [facility] at [phone] with questions or concerns regarding your invoice.   IF you received labwork today, you will receive an invoice from [facility]. Please contact [facility] at [phone] with questions or concerns regarding your invoice.   Our billing staff will not be able to assist you with questions regarding bills from these companies.  You will be contacted with the lab results as soon as they are available. The fastest way to get your results is to activate your My Chart account. Instructions are located on the last page of this paperwork. If you have not heard from [place] regarding the results in 2 weeks, please contact this office.

## 2016-12-12 NOTE — Progress Notes (Signed)
Patient ID: [name], female     DOB: [birth year], 61 y.o.    MRN: [id number]  PCP: Patient, No Pcp Per  Chief Complaint  Patient presents with  . Establish Care    remove piece of glass from right foot     Subjective:   This patient is new to me and presents to establish care and for evaluation of a piece of glass in her foot.  She has well controlled HTN and hyperlipidemia, s/p MI.  I am the PCP for her brother and his daughter, who recommended me.  Several months ago she broke a glass item in her home. She was careful to clean it up, as she prefers to go barefoot in her home. She then felt something sharp on the heel of the RIGHT foot and noted a tiny puncture wound. On several occasions she has attempted to "dig it out," presuming that there is a shard of glass in the foot. She has developed thickened skin at the site and is experiencing pain with ambulation. She desires FB removal.  She needs refills of her regular medications. Previously, these were prescribed by her cardiologist, and when last filled, she was advised she would need an office visit for additional refills.  She desires to have me fill these medications going forward, and only return to the cardiologist if there is a problem. Chart reviewed. She was last seen by cardiology 09/2015.   Review of Systems  Constitutional: Negative.   HENT: Negative for sore throat.   Eyes: Negative for visual disturbance.  Respiratory: Negative for cough, chest tightness, shortness of breath and wheezing.   Cardiovascular: Negative for chest pain and palpitations.  Gastrointestinal: Negative for abdominal pain, diarrhea, nausea and vomiting.  Genitourinary: Negative for dysuria, frequency, hematuria and urgency.  Musculoskeletal: Positive for gait problem (pain in RIGHT heel with weight bearing). Negative for arthralgias and myalgias.  Skin: Negative for rash.  Neurological: Negative for dizziness, weakness and  headaches.  Psychiatric/Behavioral: Negative for decreased concentration. The patient is not nervous/anxious.      Prior to Admission medications   Medication Sig Start Date End Date Taking? Authorizing Provider  aspirin 81 MG tablet Take 81 mg by mouth daily.     Yes [provider]  carvedilol (COREG) 6.25 MG tablet TAKE 1 TABLET BY MOUTH TWICE DAILY Patient is overdue for an appointment and needs to call and schedule for further refills 1st attempt 11/07/16  Yes Tonny Bollman, MD  lisinopril (PRINIVIL,ZESTRIL) 5 MG tablet Take 1 tablet by mouth daily. Patient is overdue for an appointment and needs to call and schedule for further refills 1st attempt 12/11/16  Yes Tonny Bollman, MD  simvastatin (ZOCOR) 40 MG tablet Take 1 tablet by mouth daily at 6 PM. Patient is overdue for an appointment and needs to call and schedule for further refills 1st attempt 12/11/16  Yes Tonny Bollman, MD  nitroGLYCERIN (NITROSTAT) 0.4 MG SL tablet Place 1 tablet (0.4 mg total) under the tongue every 5 (five) minutes as needed for chest pain. 06/03/14 05/18/16  Tonny Bollman, MD     Allergies  Allergen Reactions  . Meclizine Hcl     (ONLY IN HIGH DOSES) Slept for days  . Penicillins     Coma  . Sulfonamide Derivatives     Coma     Patient Active Problem List   Diagnosis Date Noted  . Dizziness 11/30/2013  . OLD MYOCARDIAL INFARCTION 04/28/2009  . CAD, NATIVE  VESSEL 04/28/2009  . HYPERLIPIDEMIA 03/09/2009  . HYPERTENSION 03/09/2009     Family History  Problem Relation Age of Onset  . Congestive Heart Failure Father   . Heart disease Father        pacemaker  . Diabetes Father   . Cancer Father        colon  . Dementia Mother   . Bipolar disorder Mother   . Schizophrenia Mother   . Dementia Maternal Aunt   . Parkinson's disease Maternal Aunt   . Diabetes Maternal Grandmother   . Arthritis/Rheumatoid Brother   . Anxiety disorder Brother   . Hyperlipidemia Brother       Social History   Social History  . Marital status: Married    Spouse name: Alinda Money  . Number of children: 0  . Years of education: 12++   Occupational History  . mortgage Hydrographic surveyor; multiple previous positions, including small business owner   Social History Main Topics  . Smoking status: Former Smoker    Types: Cigarettes  . Smokeless tobacco: Never Used     Comment: quit 2008  . Alcohol use Yes     Comment: socially  . Drug use: No  . Sexual activity: Not on file   Other Topics Concern  . Not on file   Social History Narrative   Lives with her husband, who is retired.   Her brother and niece live nearby.         Objective:  Physical Exam  Constitutional: She is oriented to person, place, and time. She appears well-developed and well-nourished. She is active and cooperative. No distress.  BP 110/74   Pulse 77   Temp 98.2 F (36.8 C)   Resp 16   Ht  (1.549 m)   Wt 144 lb 9.6 oz (65.6 kg)   SpO2 96%   BMI 27.32 kg/m   HENT:  Head: Normocephalic and atraumatic.  Right Ear: Hearing normal.  Left Ear: Hearing normal.  Eyes: Conjunctivae are normal. No scleral icterus.  Neck: Normal range of motion. Neck supple. No thyromegaly present.  Cardiovascular: Normal rate, regular rhythm and normal heart sounds.   Pulses:      Radial pulses are 2+ on the right side, and 2+ on the left side.  Pulmonary/Chest: Effort normal and breath sounds normal.  Musculoskeletal:       Feet:  Lymphadenopathy:       Head (right side): No tonsillar, no preauricular, no posterior auricular and no occipital adenopathy present.       Head (left side): No tonsillar, no preauricular, no posterior auricular and no occipital adenopathy present.    She has no cervical adenopathy.       Right: No supraclavicular adenopathy present.       Left: No supraclavicular adenopathy present.  Neurological: She is alert and oriented to person, place, and time. No sensory  deficit.  Skin: Skin is warm, dry and intact. No rash noted. No cyanosis or erythema. Nails show no clubbing.  Psychiatric: She has a normal mood and affect. Her speech is normal and behavior is normal.    With permission the hyperkeratotic area was pared with a 15 blade. Then, local anesthesia with 1 cc of 2% plain lidocaine was administered. Sterile prep. 3 mm punch biopsy tool use to remove a core at the center of the area. Hemostasis achieved with pressure. Area cleansed and dressed.         Assessment &  Plan:   Problem List Items Addressed This Visit    Hyperlipidemia   Relevant Medications   simvastatin (ZOCOR) 40 MG tablet   nitroGLYCERIN (NITROSTAT) 0.4 MG SL tablet   lisinopril (PRINIVIL,ZESTRIL) 5 MG tablet   carvedilol (COREG) 6.25 MG tablet   Essential hypertension   Relevant Medications   simvastatin (ZOCOR) 40 MG tablet   nitroGLYCERIN (NITROSTAT) 0.4 MG SL tablet   lisinopril (PRINIVIL,ZESTRIL) 5 MG tablet   carvedilol (COREG) 6.25 MG tablet   OLD MYOCARDIAL INFARCTION   Relevant Medications   simvastatin (ZOCOR) 40 MG tablet   nitroGLYCERIN (NITROSTAT) 0.4 MG SL tablet   lisinopril (PRINIVIL,ZESTRIL) 5 MG tablet   carvedilol (COREG) 6.25 MG tablet   CAD, NATIVE VESSEL   Relevant Medications   simvastatin (ZOCOR) 40 MG tablet   nitroGLYCERIN (NITROSTAT) 0.4 MG SL tablet   lisinopril (PRINIVIL,ZESTRIL) 5 MG tablet   carvedilol (COREG) 6.25 MG tablet   Dizziness   Relevant Medications   meclizine (ANTIVERT) 12.5 MG tablet   Osteoarthritis   Hiatal hernia   Diverticulosis of colon without hemorrhage   Estrogen deficiency    Other Visit Diagnoses    Encounter to establish care    -  Primary   Foreign body in right foot, initial encounter       Local wound care. Anticipatory guidance provided.   Screening for colon cancer       Relevant Orders   Ambulatory referral to Gastroenterology   Encounter for screening mammogram for breast cancer       Relevant  Orders   MM DIGITAL SCREENING BILATERAL   Need for influenza vaccination       Relevant Orders   Flu Vaccine QUAD 36+ mos IM (Completed)       Return for wellness visit and fasting labs in the next couple of months.   Fernande Bras, PA-C [facility]

## 2017-06-24 ENCOUNTER — Encounter: Payer: Self-pay | Admitting: Physician Assistant

## 2018-04-10 ENCOUNTER — Encounter: Payer: Self-pay | Admitting: Family Medicine

## 2020-05-20 ENCOUNTER — Other Ambulatory Visit: Payer: Self-pay | Admitting: Orthopedic Surgery

## 2020-05-20 DIAGNOSIS — M25561 Pain in right knee: Secondary | ICD-10-CM

## 2020-06-05 ENCOUNTER — Other Ambulatory Visit: Payer: Self-pay

## 2020-06-05 ENCOUNTER — Ambulatory Visit
Admission: RE | Admit: 2020-06-05 | Discharge: 2020-06-05 | Disposition: A | Discharge status: Home / Self Care | Payer: Medicare Other | Source: Ambulatory Visit | Attending: Orthopedic Surgery | Admitting: Orthopedic Surgery

## 2020-06-05 DIAGNOSIS — M25561 Pain in right knee: Secondary | ICD-10-CM

## 2021-09-14 ENCOUNTER — Other Ambulatory Visit: Payer: Self-pay | Admitting: Internal Medicine

## 2021-09-14 DIAGNOSIS — R42 Dizziness and giddiness: Secondary | ICD-10-CM

## 2021-09-15 ENCOUNTER — Ambulatory Visit
Admission: RE | Admit: 2021-09-15 | Discharge: 2021-09-15 | Disposition: A | Discharge status: Home / Self Care | Payer: Medicare Other | Source: Ambulatory Visit | Attending: Internal Medicine | Admitting: Internal Medicine

## 2021-09-15 DIAGNOSIS — R42 Dizziness and giddiness: Secondary | ICD-10-CM

## 2022-10-18 IMAGING — MR MR KNEE*R* W/O CM
4 of 6 series · 24 of 40 positions shown · non-contrast
Comparison: None.

CLINICAL DATA: Medial right knee pain with swelling over the last
several years.

EXAM:
MRI OF THE RIGHT KNEE WITHOUT CONTRAST
TECHNIQUE: Multiplanar, multisequence MR imaging of the knee was performed. No
intravenous contrast was administered.

[Series 3: T2 fat-sat · axial · 4.0mm · 0.62mm/px · z∈[-82,+48]mm · 8 of 27 slices shown (1 of 2)]
[im 1/27]
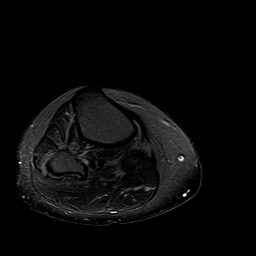
[im 4/27]
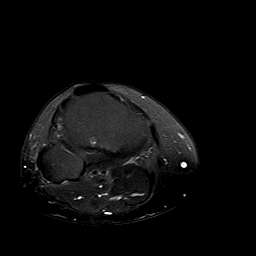
[im 8/27]
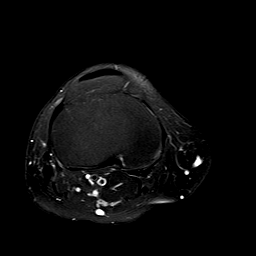
[im 12/27]
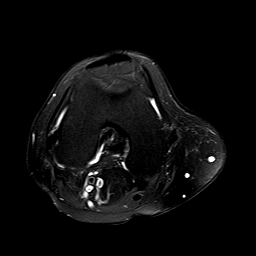
[im 15/27]
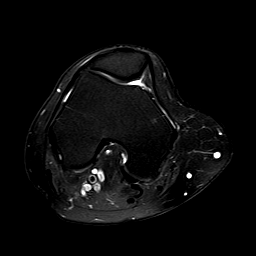
[im 19/27]
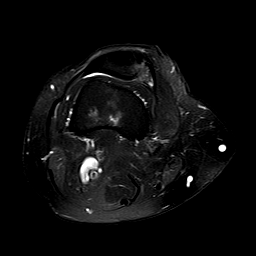
[im 23/27]
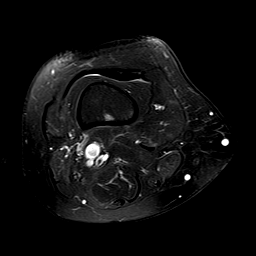
[im 27/27]
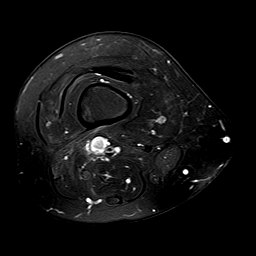

[Series 5: T2 fat-sat · coronal · 4.0mm · 0.29mm/px · 3 of 23 slices shown (2 of 2)]
[im 5/23]
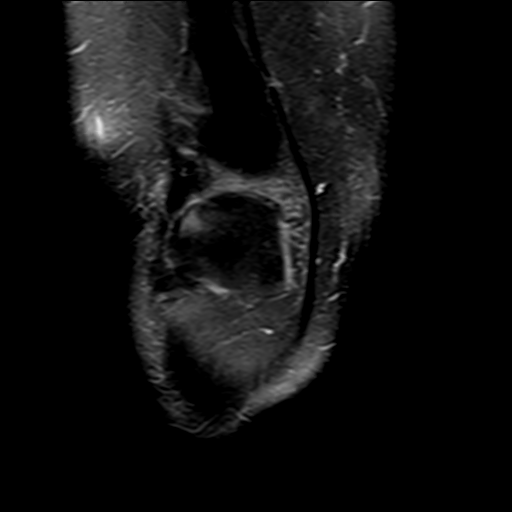
[im 14/23]
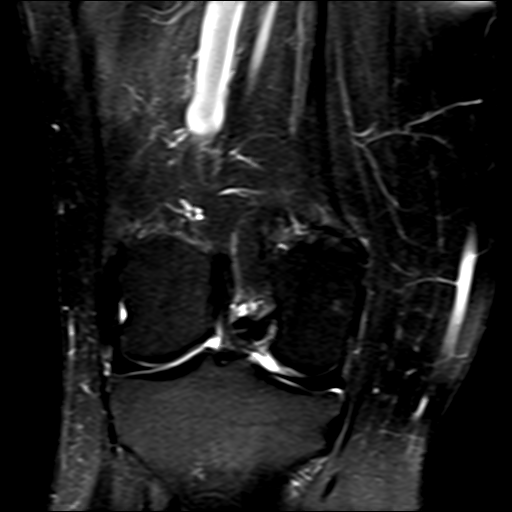
[im 23/23]
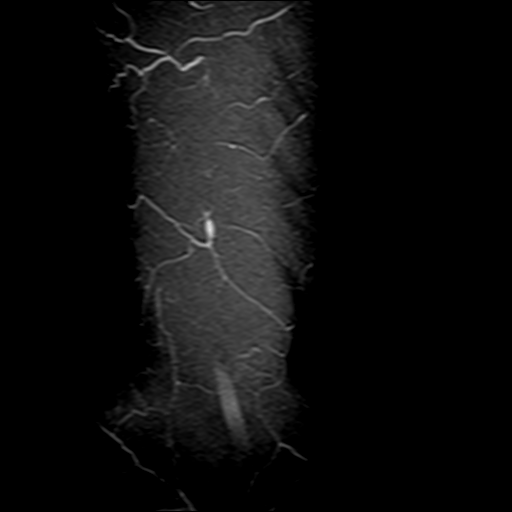

[Series 6: PD fat-sat · coronal · 4.0mm · 0.29mm/px · 6 of 23 slices shown (1 of 2)]
[im 1/23]
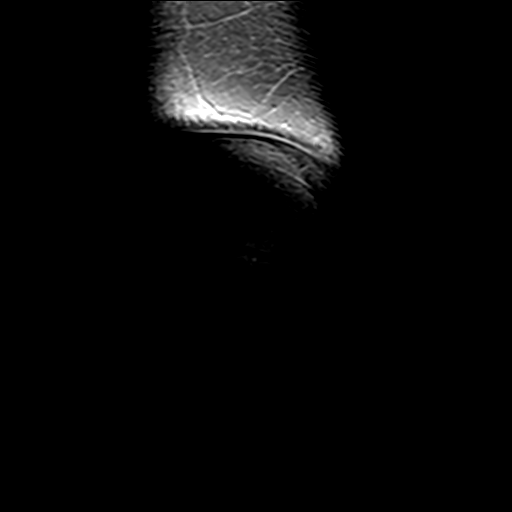
[im 5/23]
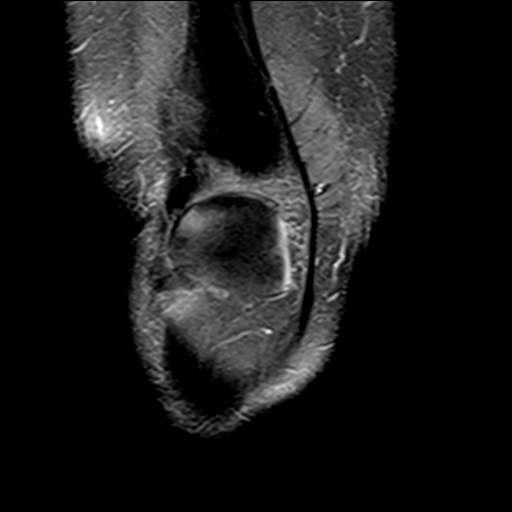
[im 9/23]
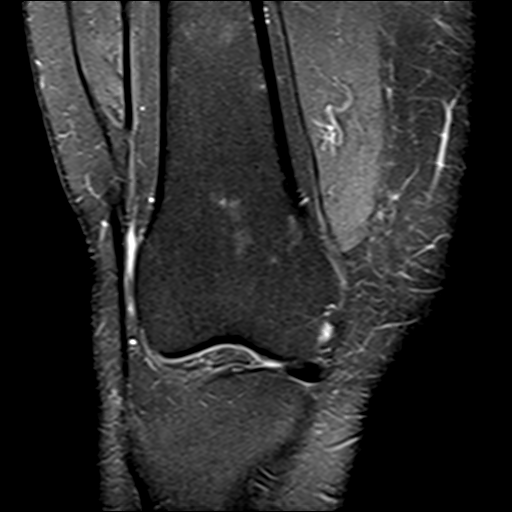
[im 14/23]
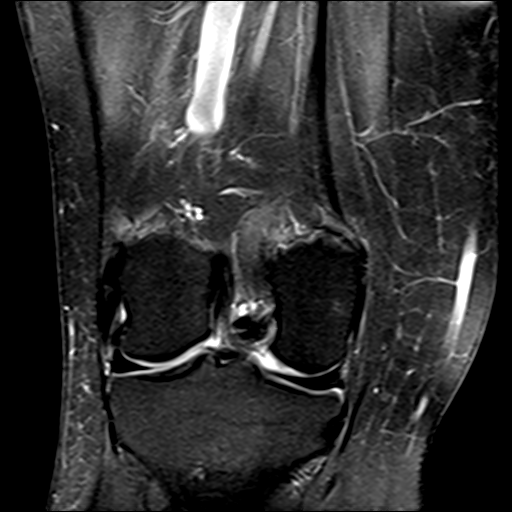
[im 18/23]
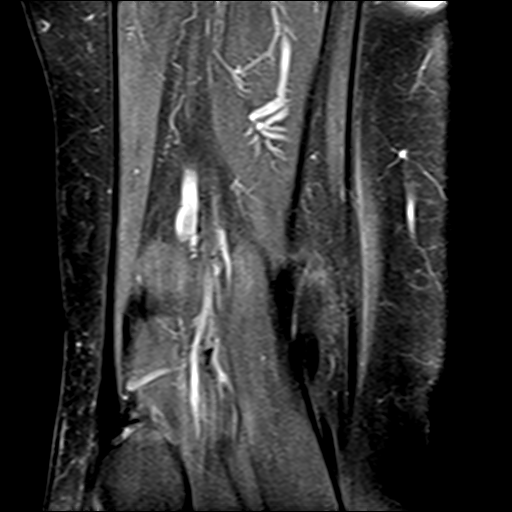
[im 23/23]
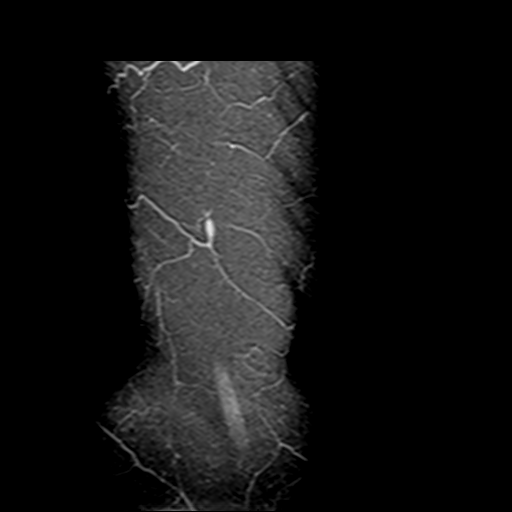

[Series 8: PD fat-sat · sagittal · 3.0mm · 0.29mm/px · 7 of 28 slices shown (2 of 2)]
[im 1/28]
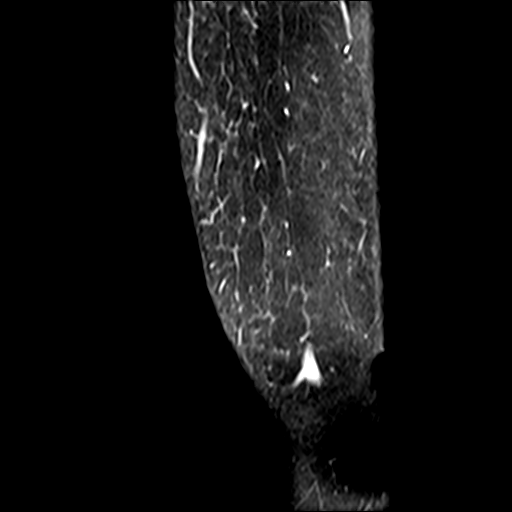
[im 5/28]
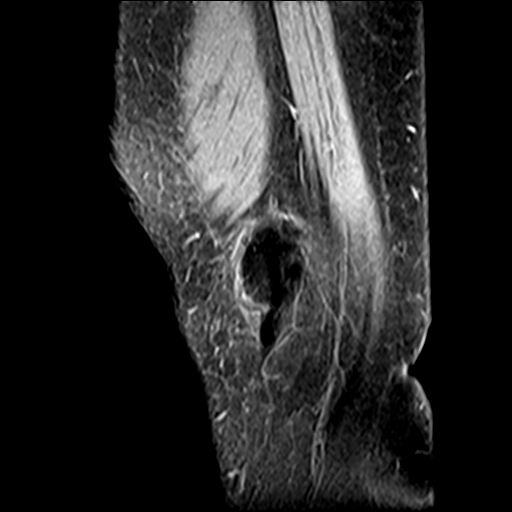
[im 10/28]
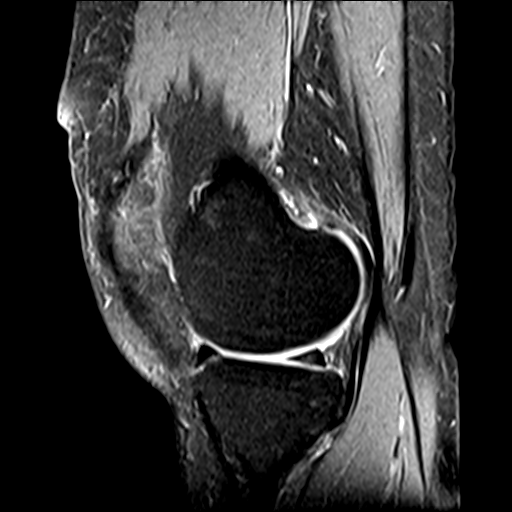
[im 14/28]
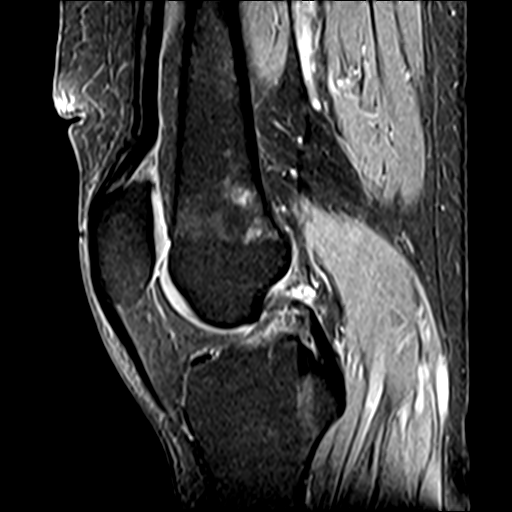
[im 19/28]
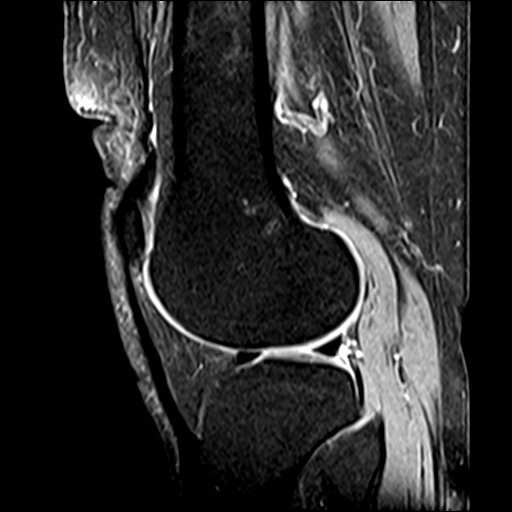
[im 23/28]
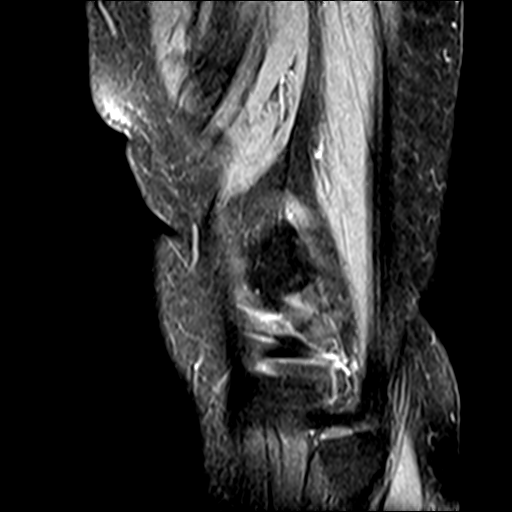
[im 28/28]
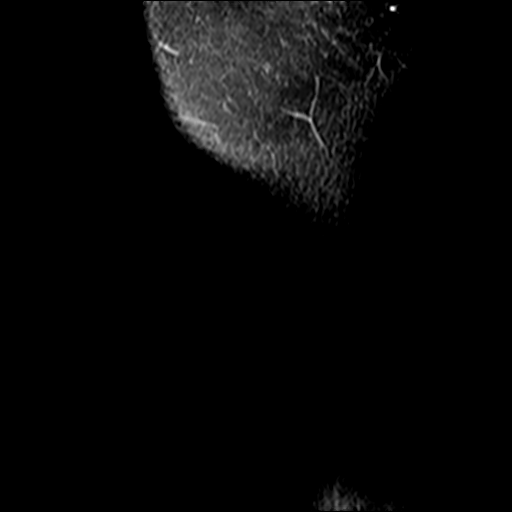

[24 of 40 positions shown; findings below may reference images not displayed]

FINDINGS: MENISCI

Medial meniscus: Slight irregularity along the inferior surface of
the posterior horn on image 21 of series 8 does not definitively
qualifies a grade 3 tear and may simply represent degenerative
signal.

Lateral meniscus:  Unremarkable

LIGAMENTS

Cruciates:  Unremarkable

Collaterals:  Unremarkable

CARTILAGE

Patellofemoral:  Unremarkable

Medial:  Moderate degenerative chondral thinning.

Lateral:  Mild degenerative chondral thinning.

Joint: Thin medial plica. Mild focal synovitis just posterior to the
distal quadriceps tendon and above the patella.

Popliteal Fossa:  Unremarkable

Extensor Mechanism: Subtle linear increased signal in the distal
quadriceps tendon is likely incidental but in the appropriate
clinical setting could represent a quadriceps sprain.

Bones:  Unremarkable

Other: No supplemental non-categorized findings.
IMPRESSION: 1. Slight irregularity along the inferior surface of the posterior
horn medial meniscus on image 21 of series 8 does not definitively
qualifies a grade 3 tear and may simply represent degenerative
signal. If the patient has a highly compelling physical exam for
medial meniscal tear then the possibility that this slight inferior
surface irregularity could represent a tear might be reconsidered.
2. Moderate degenerative chondral thinning in the medial compartment
and mild degenerative chondral thinning in the lateral compartment.
3. Thin medial plica.
4. Subtle linear increased signal in the distal quadriceps tendon is
likely incidental but in the appropriate clinical setting could
represent a quadriceps sprain.
